# Patient Record
Sex: Female | Born: 1970 | Race: White | Hispanic: No | Marital: Single | Smoking: Never smoker
Health system: Southern US, Community
[De-identification: ages and names within clinical notes are randomized; demographics above are authoritative.]

## PROBLEM LIST (undated history)

## (undated) DIAGNOSIS — K219 Gastro-esophageal reflux disease without esophagitis: Secondary | ICD-10-CM

## (undated) DIAGNOSIS — C539 Malignant neoplasm of cervix uteri, unspecified: Secondary | ICD-10-CM

## (undated) DIAGNOSIS — J302 Other seasonal allergic rhinitis: Secondary | ICD-10-CM

## (undated) DIAGNOSIS — E785 Hyperlipidemia, unspecified: Secondary | ICD-10-CM

## (undated) DIAGNOSIS — I10 Essential (primary) hypertension: Secondary | ICD-10-CM

## (undated) DIAGNOSIS — K279 Peptic ulcer, site unspecified, unspecified as acute or chronic, without hemorrhage or perforation: Secondary | ICD-10-CM

## (undated) DIAGNOSIS — F32A Depression, unspecified: Secondary | ICD-10-CM

## (undated) DIAGNOSIS — F329 Major depressive disorder, single episode, unspecified: Secondary | ICD-10-CM

## (undated) HISTORY — PX: UPPER GASTROINTESTINAL ENDOSCOPY: SHX188

## (undated) HISTORY — DX: Depression, unspecified: F32.A

## (undated) HISTORY — PX: CARDIAC CATHETERIZATION: SHX172

## (undated) HISTORY — PX: TOTAL ABDOMINAL HYSTERECTOMY: SHX209

## (undated) HISTORY — DX: Hyperlipidemia, unspecified: E78.5

## (undated) HISTORY — PX: COMBINED HYSTERECTOMY ABDOMINAL W/ MMK / BURCH PROCEDURE: SUR293

## (undated) HISTORY — DX: Major depressive disorder, single episode, unspecified: F32.9

## (undated) HISTORY — DX: Other seasonal allergic rhinitis: J30.2

## (undated) HISTORY — DX: Essential (primary) hypertension: I10

## (undated) HISTORY — DX: Malignant neoplasm of cervix uteri, unspecified: C53.9

## (undated) HISTORY — PX: BREAST BIOPSY: SHX20

## (undated) HISTORY — DX: Gastro-esophageal reflux disease without esophagitis: K21.9

## (undated) HISTORY — DX: Peptic ulcer, site unspecified, unspecified as acute or chronic, without hemorrhage or perforation: K27.9

---

## 1993-10-22 DIAGNOSIS — C539 Malignant neoplasm of cervix uteri, unspecified: Secondary | ICD-10-CM

## 1993-10-22 HISTORY — DX: Malignant neoplasm of cervix uteri, unspecified: C53.9

## 1997-12-15 ENCOUNTER — Ambulatory Visit (HOSPITAL_COMMUNITY): Admission: RE | Admit: 1997-12-15 | Discharge: 1997-12-15 | Payer: Self-pay | Admitting: Family Medicine

## 1999-12-14 ENCOUNTER — Ambulatory Visit (HOSPITAL_COMMUNITY): Admission: RE | Admit: 1999-12-14 | Discharge: 1999-12-14 | Payer: Self-pay | Admitting: Internal Medicine

## 1999-12-14 ENCOUNTER — Encounter: Payer: Self-pay | Admitting: Internal Medicine

## 1999-12-15 ENCOUNTER — Encounter: Admission: RE | Admit: 1999-12-15 | Discharge: 1999-12-15 | Payer: Self-pay | Admitting: Internal Medicine

## 1999-12-15 ENCOUNTER — Encounter: Payer: Self-pay | Admitting: Internal Medicine

## 1999-12-15 ENCOUNTER — Other Ambulatory Visit: Admission: RE | Admit: 1999-12-15 | Discharge: 1999-12-15 | Payer: Self-pay | Admitting: Family Medicine

## 2000-12-12 ENCOUNTER — Other Ambulatory Visit: Admission: RE | Admit: 2000-12-12 | Discharge: 2000-12-12 | Payer: Self-pay | Admitting: Obstetrics and Gynecology

## 2000-12-13 ENCOUNTER — Ambulatory Visit (HOSPITAL_COMMUNITY): Admission: RE | Admit: 2000-12-13 | Discharge: 2000-12-13 | Payer: Self-pay | Admitting: Internal Medicine

## 2000-12-13 ENCOUNTER — Encounter (INDEPENDENT_AMBULATORY_CARE_PROVIDER_SITE_OTHER): Payer: Self-pay | Admitting: Specialist

## 2001-08-21 ENCOUNTER — Ambulatory Visit (HOSPITAL_COMMUNITY): Admission: RE | Admit: 2001-08-21 | Discharge: 2001-08-21 | Payer: Self-pay | Admitting: Obstetrics and Gynecology

## 2001-08-21 HISTORY — PX: TUBAL LIGATION: SHX77

## 2002-01-16 ENCOUNTER — Other Ambulatory Visit: Admission: RE | Admit: 2002-01-16 | Discharge: 2002-01-16 | Payer: Self-pay | Admitting: Obstetrics and Gynecology

## 2002-04-22 ENCOUNTER — Encounter: Payer: Self-pay | Admitting: Emergency Medicine

## 2002-04-22 ENCOUNTER — Emergency Department (HOSPITAL_COMMUNITY): Admission: EM | Admit: 2002-04-22 | Discharge: 2002-04-22 | Payer: Self-pay | Admitting: Emergency Medicine

## 2003-06-03 ENCOUNTER — Ambulatory Visit (HOSPITAL_COMMUNITY): Admission: RE | Admit: 2003-06-03 | Discharge: 2003-06-03 | Payer: Self-pay | Admitting: Cardiology

## 2003-06-03 ENCOUNTER — Encounter: Payer: Self-pay | Admitting: Cardiology

## 2003-06-08 ENCOUNTER — Other Ambulatory Visit: Admission: RE | Admit: 2003-06-08 | Discharge: 2003-06-08 | Payer: Self-pay | Admitting: Obstetrics and Gynecology

## 2004-01-12 ENCOUNTER — Ambulatory Visit (HOSPITAL_COMMUNITY): Admission: RE | Admit: 2004-01-12 | Discharge: 2004-01-12 | Payer: Self-pay | Admitting: Internal Medicine

## 2004-01-20 ENCOUNTER — Ambulatory Visit (HOSPITAL_COMMUNITY): Admission: RE | Admit: 2004-01-20 | Discharge: 2004-01-20 | Payer: Self-pay | Admitting: Internal Medicine

## 2004-01-26 ENCOUNTER — Observation Stay (HOSPITAL_COMMUNITY): Admission: RE | Admit: 2004-01-26 | Discharge: 2004-01-27 | Payer: Self-pay | Admitting: Surgery

## 2004-01-26 ENCOUNTER — Encounter (INDEPENDENT_AMBULATORY_CARE_PROVIDER_SITE_OTHER): Payer: Self-pay | Admitting: Specialist

## 2004-01-26 HISTORY — PX: LAPAROSCOPIC CHOLECYSTECTOMY: SUR755

## 2004-06-09 ENCOUNTER — Other Ambulatory Visit: Admission: RE | Admit: 2004-06-09 | Discharge: 2004-06-09 | Payer: Self-pay | Admitting: Obstetrics and Gynecology

## 2004-12-01 ENCOUNTER — Ambulatory Visit (HOSPITAL_COMMUNITY): Admission: RE | Admit: 2004-12-01 | Discharge: 2004-12-01 | Payer: Self-pay | Admitting: Obstetrics and Gynecology

## 2004-12-01 ENCOUNTER — Encounter (INDEPENDENT_AMBULATORY_CARE_PROVIDER_SITE_OTHER): Payer: Self-pay | Admitting: *Deleted

## 2004-12-01 HISTORY — PX: ENDOMETRIAL ABLATION: SHX621

## 2005-06-14 ENCOUNTER — Other Ambulatory Visit: Admission: RE | Admit: 2005-06-14 | Discharge: 2005-06-14 | Payer: Self-pay | Admitting: Obstetrics and Gynecology

## 2005-07-16 ENCOUNTER — Ambulatory Visit: Payer: Self-pay | Admitting: Internal Medicine

## 2005-07-16 ENCOUNTER — Observation Stay (HOSPITAL_COMMUNITY): Admission: AD | Admit: 2005-07-16 | Discharge: 2005-07-18 | Payer: Self-pay | Admitting: Internal Medicine

## 2006-05-16 ENCOUNTER — Ambulatory Visit (HOSPITAL_COMMUNITY): Admission: RE | Admit: 2006-05-16 | Discharge: 2006-05-16 | Payer: Self-pay | Admitting: *Deleted

## 2006-10-17 ENCOUNTER — Inpatient Hospital Stay (HOSPITAL_COMMUNITY): Admission: RE | Admit: 2006-10-17 | Discharge: 2006-10-20 | Payer: Self-pay | Admitting: Obstetrics and Gynecology

## 2006-10-17 ENCOUNTER — Encounter (INDEPENDENT_AMBULATORY_CARE_PROVIDER_SITE_OTHER): Payer: Self-pay | Admitting: Specialist

## 2008-02-09 ENCOUNTER — Inpatient Hospital Stay (HOSPITAL_BASED_OUTPATIENT_CLINIC_OR_DEPARTMENT_OTHER): Admission: RE | Admit: 2008-02-09 | Discharge: 2008-02-09 | Payer: Self-pay | Admitting: Cardiovascular Disease

## 2008-02-09 ENCOUNTER — Ambulatory Visit: Payer: Self-pay | Admitting: Cardiovascular Disease

## 2010-01-15 ENCOUNTER — Emergency Department (HOSPITAL_COMMUNITY): Admission: EM | Admit: 2010-01-15 | Discharge: 2010-01-15 | Payer: Self-pay | Admitting: Family Medicine

## 2011-01-15 LAB — POCT RAPID STREP A (OFFICE): Streptococcus, Group A Screen (Direct): NEGATIVE

## 2011-03-06 NOTE — Cardiovascular Report (Signed)
[name], [name]              ACCOUNT NO.:  [id number]   MEDICAL RECORD NO.:  [id number]           PATIENT TYPE:   LOCATION:                                 FACILITY:   PHYSICIAN:  Veverly Fells. Excell Seltzer, MD  DATE OF BIRTH:  June 24, 1971   DATE OF PROCEDURE:  DATE OF DISCHARGE:                            CARDIAC CATHETERIZATION   PROCEDURES:  1. Left heart catheterization.  2. Selective coronary angiography.  3. Left ventricular angiography.   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  She was seen by Dr. Gala Romney this morning in the office.  She  has had an exertional component of her symptoms with constant pressure  and it is worse with walking or other activity.  She has a strong family  history of CAD.  She was referred for cardiac catheterization in the  setting of ongoing symptoms.   Risks and indications of the procedure were reviewed.  Informed consent  was obtained.  The right groin was prepped, draped, and anesthetized  with 1% lidocaine using modified Seldinger technique.  A 4-French sheath  was placed in the right femoral artery.  Standard 4-French Judkins  catheters were used for coronary angiography and left ventriculography.  All catheter exchanges were performed over a guidewire.  There were no  immediate complications.   FINDINGS:  Aortic pressure 112/66 with mean of 87 and left ventricular  pressure 116 over 12.   Coronary angiography:  The left mainstem is widely patent.  There are no  irregularities or stenoses.  The vessel bifurcates into the LAD and left  circumflex.   LAD:  The LAD is large-caliber vessel that courses down and reaches the  LV apex.  There is a small first diagonal and a moderate-sized second  diagonal branch.  The LAD and its diagonal branches are free of any  significant disease.   Left circumflex:  The left circumflex is a large  vessel.  It supplies 2  main OM branches.  Both OMs are widely patent with no significant  stenosis.  The AV groove circumflex is also widely patent with no  stenosis.   Right coronary artery:  The RCA is a dominant vessel.  It supplies 2  small PDA branches and a posterolateral branch.  There is a moderate-  sized acute marginal branch.  There is no significant stenosis  throughout the right coronary artery or its branch vessels.   Left ventriculography:  The left ventricular function is normal.  The  LVEF is estimated at 65%.   ASSESSMENT:  1. Normal coronary arteries.  2. Normal left ventricular function.   DISCUSSION:  We will rule out significant noncardiac causes of chest  pain with a D-dimer and liver function tests as well as an amylase and  lipase to make sure that Rosann Auerbach does not have pancreatitis or any liver  abnormalities as a potential etiology to her pain.  Pending those lab,  we will plan on discharge after her post cath observation.  She was  reassured about  her coronary status.      Veverly Fells. Excell Seltzer, MD  Electronically Signed     MDC/MEDQ  D:  02/09/2008  T:  02/10/2008  Job:  [id number]

## 2011-03-09 NOTE — Op Note (Signed)
NAMEKINZE, LABO              ACCOUNT NO.:  [id number]   MEDICAL RECORD NO.:  [id number]          PATIENT TYPE:  AMB   LOCATION:  SDC                           FACILITY:  [facility]   PHYSICIAN:  Miguel Aschoff, M.D.       DATE OF BIRTH:  08/14/1971   DATE OF PROCEDURE:  12/01/2004  DATE OF DISCHARGE:                                 OPERATIVE REPORT   PREOPERATIVE DIAGNOSIS:  Menorrhagia.   POSTOPERATIVE DIAGNOSIS:  Menorrhagia.   PROCEDURES:  Cervical dilatation, hysteroscopy, uterine curettage,  endometrial ablation with ThermaChoice balloon.   SURGEON:  Miguel Aschoff, M.D.   ANESTHESIA:  General.   COMPLICATIONS:  None.   JUSTIFICATION:  The patient is a 40 year old white female with a history of  menorrhagia, who has been on medical therapy using birth control pills to  control her bleeding despite her prior tubal sterilization.  She presents  now to undergo an outpatient procedure in an effort to control her bleeding,  so she stopped taking her oral contraceptives.  She has given her informed  consent for endometrial ablation.  The risks and benefits of the procedure  were discussed with the patient.   PROCEDURE:  The patient was taken to the operating room and placed in the  supine position. General anesthesia was administered without difficulty.  She was then placed in the dorsal lithotomy position and prepped and draped  in the usual sterile fashion.  The bladder was catheterized.  Examination  was carried out, which revealed the uterus to be anterior, normal size and  shape.  No adnexal masses were noted.  The speculum was placed in the  vaginal vault.  Anterior cervical lip was grasped with the tenaculum and  then the uterus was sounded to 8 cm.  The endocervical canal was sounded to  3.5 cm.  Once this was done, the diagnostic hysteroscope was advanced  through the cervix.  No endocervical lesions were noted.  The endometrial  cavity was visualized and no polyps or  submucous myomas were noted.  At this  point the NovaSure endometrial ablation unit was placed in the endometrial  cavity and positioned but did not succeed in passing the cavity test, so  this procedure was abandoned.  In its place a ThermaChoice balloon system  was used, and without difficulty an eight-minute treatment cycle was carried  out at 87 degrees within the confines of the uterine cavity without  difficulty.  Following the eight-minute ThermaChoice treatment cycle, all  instruments were removed, hemostasis was readily achieved.  The patient was  reversed from the anesthetic and taken to the recovery room in stable  condition.  The estimated blood loss was less than 20 mL.   The plan is for the patient to be discharged home.  Medications for home  include doxycycline 100 mg twice a day x3 days, Darvocet-N 100 one every  four hours as needed for pain.  She will be seen back in four weeks for  follow-up examination, and she is to call for any problems such as fever,  pain or heavy bleeding.  AR/MEDQ  D:  12/01/2004  T:  12/01/2004  Job:  [id number]

## 2011-03-09 NOTE — Op Note (Signed)
[name], [name]              ACCOUNT NO.:  [id number]   MEDICAL RECORD NO.:  [id number]          PATIENT TYPE:  INP   LOCATION:  9319                          FACILITY:  [facility]   PHYSICIAN:  Randye Lobo, M.D.   DATE OF BIRTH:  [birth year]   DATE OF PROCEDURE:  10/17/2006  DATE OF DISCHARGE:                               OPERATIVE REPORT   PREOPERATIVE DIAGNOSIS:  Genuine stress incontinence.   POSTOPERATIVE DIAGNOSIS:  Genuine stress incontinence.   PROCEDURE:  Abdominal Burch procedure with cystoscopy.   SURGEON:  Conley Simmonds, MD   ASSISTANT:  Miguel Aschoff, M.D.   URINE OUTPUT:  Quantity sufficient.   COMPLICATIONS:  None.   INDICATIONS FOR PROCEDURE:  The patient is a 40 year old female with a  history of menorrhagia who is cared for by Dr. Duane Lope.  The patient  had previously undergone an endometrial ablation and continued to have  vaginal bleeding problems and Dr. Tenny Craw is planning to proceed with a  hysterectomy with bilateral salpingo-oophorectomy.  The patient has a  history of urinary leakage with stressful maneuvers and wishes to have  surgical treatment at the time of her hysterectomy.  Urodynamic testing  in the office confirms the presence of genuine stress incontinence.  The  patient is noted to have very minimal vaginal and uterine descensus.  The patient will undergo a Monarch sling procedure if the patient is  able to have a successful laparoscopically assisted vaginal hysterectomy  with bilateral salpingo-oophorectomy.  If the patient has conversion to  an abdominal hysterectomy, the plan is to proceed with an abdominal  Burch procedure with cystoscopy.  Risks, benefits, and alternatives have  been discussed with the patient who wishes to proceed.   FINDINGS:  The patient is noted to have very minimal uterine descensus  with exam under anesthesia and a plan is therefore made by Dr. Tenny Craw to  proceed with an abdominal hysterectomy with bilateral  salpingo-  oophorectomy.   At the time of cystoscopy the bladder was visualized throughout 360  degrees including the bladder dome and trigone.  Ther is no evidence of  a foreign bocy in the bladder or urethra.  The ureters were noted to be  patent bilaterally.   SPECIMENS:  None.   PROCEDURE:  The patient was reidentified in the preoperative hold area.  She was brought down to the operating room where general endotracheal  anesthesia was induced and she was placed in the dorsal lithotomy  position.  The patient was sterilely prepped and draped and Dr. Tenny Craw  performed an examination under anesthesia.  He performed a total  abdominal hysterectomy with bilateral salpingo-oophorectomy.  Please  refer to this dictation separately.   After the vaginal cuff was closed and hemostasis was insured, I took  over the procedure for the abdominal Burch procedure and cystoscopy.  The patient had a self-retaining retractor in the abdomen.  A Cherney  modification was performed of the patient's Pfannenstiel incision.  The  rectus muscles were dissected off of their tendinous insertions  inferiorly using monopolar cautery.  This provided good access into the  retropubic space.  The patient had a Foley catheter to drainage.  Dissection of the paravaginal spaces was performed bluntly and the arcus  tendineus fascia pelvis was identified bilaterally.  The Cooper's  ligaments were reidentified as well.   A gloved hand was then placed inside the vagina and the Burch procedure  was performed with a total of four sutures of 0 Ethibond and with a  double-armed needle.  Two sutures were placed to the right of the  urethra and two sutures were placed to the left of the urethra.  The  first suture that was placed was placed at the level of the mid urethra  and lateral to it and the second suture was placed lateral to this at  the level of the urethrovesical junction.  Each suture was brought  through the  endopelvic fascia in a figure-of-eight fashion and then each  arm of the needles were brought up through the Cooper's ligaments on the  ipsilateral sides.  There was some bleeding with placement of the  vaginal sutures on the patient's left-hand side and the Ethibond sutures  were therefore tied at the level of the vagina.  This did provide good  hemostasis.  After each arm of the sutures were brought through the  Cooper's ligaments, the sutures were tied above the Cooper's ligaments  while elevating the vagina from below.  This provided good elevation of  the urethra.   Cystoscopy well was then performed at this time and the findings are as  noted above.  There was no evidence of a foreign body in the bladder or  the urethra.  The cystoscope was then removed and a Foley catheter was  replaced and the bladder was completely drained.   At this point Dr. Tenny Craw performed closure of the abdomen including  closure of the Cherney modification.  Hemostasis was noted to be good at  this time.  Please refer to his portion of this dictation separately.   The patient's Foley catheter was left to gravity drainage.  There were  no complications to the procedure.  All needle, instrument, sponge  counts were correct.      Randye Lobo, M.D.  Electronically Signed     BES/MEDQ  D:  10/17/2006  T:  10/17/2006  Job:  [id number]

## 2011-03-09 NOTE — Discharge Summary (Signed)
[name], [name]              ACCOUNT NO.:  [id number]   MEDICAL RECORD NO.:  [id number]          PATIENT TYPE:  OBV   LOCATION:  5011                         FACILITY:  [facility]   PHYSICIAN:  Rene Paci, M.D. LHCDATE OF BIRTH:  [birth year]   DATE OF ADMISSION:  07/16/2005  DATE OF DISCHARGE:  07/18/2005                                 DISCHARGE SUMMARY   DISCHARGE DIAGNOSIS:  1.  Acute gastroenteritis.  2.  Hypokalemia secondary to vomiting and diarrhea.   HISTORY OF PRESENT ILLNESS:  The patient is a 40 year old white female who  presents as a direct admission with complaints of abdominal pain, nausea and  vomiting, as well as diarrhea, which started approximately 4-5 days prior to  admission.  At the time of admission, the patient reports resolution of  diarrhea but continued nausea and abdominal pain localized at the epigastric  and right upper quadrant region.   PAST MEDICAL HISTORY:  1.  GERD.  2.  Ulcers.  3.  Depression.   PAST SURGICAL HISTORY:  1.  Status post C-section in 1995.  2.  Status post laparoscopic cholecystectomy in April 2005.  3.  Status post tubal ligation.  4.  Status post endometrial ablation.  5.  Status post multiple endoscopies for ulcers.   HOSPITAL COURSE:  The patient was admitted and placed on antiemetics as well  as IV fluids for hydration.  A KUB was performed which was negative for  small bowel obstruction and showed no free air.  On July 17, 2005, the  patient's diet was advanced to clears and today, has been advanced to full,  regular diet the patient is tolerating without any difficulties.  The  patient was noted to have hypokalemia during this admission and this has  been repleted.  At the time of discharge, the patient's potassium is 3.  We  will give an additional dose of K-Dur prior to discharge.   DISCHARGE MEDICATIONS:  1.  Nexium 40 mg p.o. daily.  2.  Prozac 40 mg p.o. daily.   FOLLOW UP:  The patient is to call  Dr. Felicity Coyer or go to the emergency room  should she develop nausea, vomiting, or diarrhea.      Melissa S. Peggyann Juba, NP      Rene Paci, M.D. [facility]  Electronically Signed    MSO/MEDQ  D:  07/18/2005  T:  07/18/2005  Job:  [id number]

## 2011-03-09 NOTE — Discharge Summary (Signed)
[name], [name]              ACCOUNT NO.:  [id number]   MEDICAL RECORD NO.:  [id number]          PATIENT TYPE:  INP   LOCATION:  9319                          [facility]   PHYSICIAN:  Miguel Aschoff, M.D.       DATE OF BIRTH:  Mar 22, 1971   DATE OF ADMISSION:  10/17/2006  DATE OF DISCHARGE:  10/20/2006                               DISCHARGE SUMMARY   ADMISSION DIAGNOSES:  1. Menorrhagia.  2. Urinary stress incontinence.   FINAL DIAGNOSES:  1. Menorrhagia.  2. Urinary stress incontinence.  3. Extensive adenomyosis.   OPERATIONS AND PROCEDURES:  Total abdominal hysterectomy with bilateral  salpingo-oophorectomy.  Burch procedure.   BRIEF HISTORY:  The patient is a 40 year old white female with a history  of menorrhagia that had been previously treated with continuous birth  control pills and in addition, NovaSure endometrial ablation.  However,  because of recurrent bleeding and pelvic pain, the patient now requested  definitive procedure to be carried out and therefore to control her  bleeding.  She also because of family history of ovarian cancer, stated  that she desired removal of her tubes and ovaries at the time of the  surgery, with the understanding that she would need postoperative  hormonal replacement therapy.  Also because of urinary stress  incontinence confirmed by urodynamic studies, the patient was admitted  to also have a Burch procedure performed by Dr. Edward Jolly to control this  problem.   HOSPITAL COURSE:  Preoperative studies were obtained, this revealed an  initial hemoglobin of 12.9, hematocrit 37.7, white count was 6200.  Chemistry profile was within normal limits.   The patient was taken to the operating room on October 17, 2006.  Examination under anesthesia was carried out.  Revealed a very narrow  pubic arch, in addition there was essentially no uterine descensus.  Because of these findings under anesthesia, it was elected to proceed  with  total abdominal hysterectomy, bilateral salpingo-oophorectomy  rather than laparoscopic hysterectomy.  Surgical procedure was carried  out without difficulty, with the total abdominal hysterectomy and  bilateral salpingo-oophorectomy carried out without difficulty.  This  was followed by a Burch procedure.   The patient's postoperative course was essentially uncomplicated.  She  tolerated increasing ambulation and diet well.  Her hemoglobin remained  stable, reached a nadir point of 11.0.  The patient had no voiding  difficulties with no significant residual urine in the bladder.  By the  third postoperative day was able to be discharged home.   MEDICATIONS FOR HOME:  Tylox 1 every 3 hours as needed for pain.   She was instructed no heavy lifting, place nothing in the vagina.  To  call for any problems such as fever, pain or heavy bleeding.   Final pathology report on the hysterectomy and oophorectomy specimen  revealed benign proliferative endometrium, extensive adenomyosis.  There  was no significant pathology noted on the tubes and ovaries.   Plans is for the patient is to return to the office in 4 weeks for  follow up examination.  She is to call for any problems  such as fever,  pain or heavy bleeding.  She was sent home on a regular diet in  satisfactory condition.      Miguel Aschoff, M.D.  Electronically Signed     AR/MEDQ  D:  10/24/2006  T:  10/24/2006  Job:  [id number]

## 2011-03-09 NOTE — Op Note (Signed)
NAME:  [name], [name]                        ACCOUNT NO.:  [id number]   MEDICAL RECORD NO.:  [id number]                   PATIENT TYPE:  OBV   LOCATION:  0342                                 FACILITY:  [facility]   PHYSICIAN:  Thornton Park. Daphine Deutscher, M.D.             DATE OF BIRTH:  Mar 13, 1971   DATE OF PROCEDURE:  01/26/2004  DATE OF DISCHARGE:                                 OPERATIVE REPORT   PREOPERATIVE DIAGNOSES:  Acalculous biliary dyskinesia/chronic  cholecystitis.   POSTOPERATIVE DIAGNOSES:  Chronic cholecystitis.   SURGEON:  Thornton Park. Daphine Deutscher, M.D.   ASSISTANT:  Angelia Mould. Derrell Lolling, M.D.   PROCEDURE:  Laparoscopic cholecystectomy with intraoperative cholangiogram.   DESCRIPTION OF PROCEDURE:  [name] is a 39 year old white female who  lives in [place], [place] but works down here as a PA for [employer] who has had almost four weeks of recurrent right upper quadrant  pain, nausea and vomiting. She has been endoscoped and worked up by Dr.  Juanda Chance and this showed a gallbladder ultrasound which did not show stones.  Informed consent was obtained regarding the possibility that this would not  relieve her abdominal pain but she was wanting to go ahead and get her  gallbladder out as she felt like her symptoms were most consistent with  biliary dyskinesia.   The patient was taken back to room 12 and given general anesthesia. The  abdomen was prepped with Betadine and draped sterilely. I excised her old  infraumbilical curvilinear incision and went through that and then entered  the fascia longitudinally with a stay suture of #0 Vicryl at the superior  part of that longitudinal incision. The Hasson cannula was inserted and  abdomen was insufflated without difficulty. Three trocars were placed in the  upper abdomen.  The gallbladder was grasped, elevated and it is depicted in  the chart but you can see numerous adhesions of the surrounding omentum  stuck up to the  infundibulum in the neck and the cystic duct region. We took  those down and dissected out Calot's triangle and did find a fairly  remarkable inflammatory resistance in that area and we were able to free  this up.  I put a clip upon the gallbladder and incised it and inserted a  catheter and a dynamic cholangiogram which showed good filling of the cystic  duct with common duct filling and intrahepatic filling and prompt flow into  the duodenum and with a small wisp of contrast going up into the pancreatic  duct.  The cystic duct was triple clipped and divided, cystic artery was  double clipped, divided and the gallbladder was removed from the gallbladder  bed with the hook electrocautery without entering it.  Hemostasis was  achieved as we worked our way up the back wall.  No bleeding or bile leaks  were noted during this process and prior to detachment we looked at the  gallbladder  bed and again no bleeding or bile leaks were noted.  It was  detached and brought out through the umbilicus where it was decompressed and  brought out without difficulty.   The umbilical port was repaired laparoscopic visualization with a figure-of-  eight suture of #0 Vicryl. We then went back and looked in the right upper  quadrant, irrigated and aspirated the irrigant.  The abdomen was deflated,  the  trocars were all removed.  The wounds were all closed with 4-0 Vicryl. Prior  to making the incisions, all were injected with 0.5% Marcaine.  Following  closure, Benzoin and Steri-Strips were applied.  The patient was taken to  the recovery room in satisfactory condition.                                               Thornton Park Daphine Deutscher, M.D.    MBM/MEDQ  D:  01/26/2004  T:  01/26/2004  Job:  [id number]   cc:   [name], M.D. [facility]

## 2011-03-09 NOTE — Op Note (Signed)
[name], WELLIVER              ACCOUNT NO.:  [id number]   MEDICAL RECORD NO.:  [id number]          PATIENT TYPE:  INP   LOCATION:  9319                          FACILITY:  [facility]   PHYSICIAN:  Miguel Aschoff, M.D.       DATE OF BIRTH:  Mar 09, 1971   DATE OF PROCEDURE:  10/17/2006  DATE OF DISCHARGE:                               OPERATIVE REPORT   PREOPERATIVE DIAGNOSIS:  Menorrhagia status post endometrial ablation.   POSTOPERATIVE DIAGNOSIS:  Menorrhagia status post endometrial ablation.   OPERATION:  Total abdominal hysterectomy, bilateral salpingo-  oophorectomy by Dr. Edward Jolly during the Burch procedure.   SURGEON:  Miguel Aschoff, M.D.   ASSISTANT:  Robbie Louis, M.D.   ANESTHESIA:  General.   COMPLICATIONS:  None.   JUSTIFICATION:  The patient is a 40 year old white female with a history  of menorrhagia and pelvic pain.  The patient has been treated previously  with laparoscopy.  In addition, the patient has undergone NovaSure  endometrial ablation only to have return of her heavy bleeding.   CONDITION:  The patient has urinary stress incontinence and requested  this be corrected at the time that her surgery is performed to control  the menorrhagia.  She also has requested that her ovaries be removed at  the time of surgery.  She does understand this will result in her  requiring hormone replacement therapy since she is only 40 years old.  With his information, informed consent has been obtained.   PROCEDURE:  The patient was taken to the operating room, placed in the  supine position and general anesthesia was administered without  difficulty.  She was then placed in modified lithotomy position, prepped  and draped in the usual sterile fashion.  Under anesthesia, an  examination was carried out which revealed absolutely no cervical or  uterine descensus and with the cervix being very high in the vaginal  vault, it was felt that laparoscopic approach to the hysterectomy  was  not indicated.  Therefore it was elected to proceed with total abdominal  hysterectomy and bilateral salpingo-oophorectomy.  A Foley catheter was  inserted.  Pfannenstiel incision was made, extended down through the  subcutaneous tissue with bleeding points being clamped and coagulated as  they were encountered.  The fascia was then identified, incised  transversely and then separated from the underlying rectus muscles.  The  rectus muscles were divided in the midline.  The peritoneum was then  found and entered carefully avoiding underlying structures.  Peritoneal  incision was then extended under direct visualization.  At this point a  self-retaining retractor was placed through the wound.  The viscera  packed out of the pelvis.  Inspection revealed the uterus to be  anterior, normal size and shape.  The bladder peritoneum was  unremarkable.  There was some scarring inside the patient's previous  cesarean section.  Ovaries were within normal limits.  The cul-de-sac  was unremarkable.  Intestinal surfaces appeared to be within normal  limits.  At this point the round ligaments were identified, clamped, cut  and suture ligated using suture ligatures of  0 Vicryl.  Then a bladder  flap was created anteriorly.  At this point perforations were made below  the infundibulopelvic ligaments.  These ligaments were clamped with  Heaney clamps, pedicles were cut and suture ligated using suture  ligatures of 0 Vicryl and then free ties of 0 Vicryl.  At this point the  broad ligament was skeletonized.  The uterine vessels were identified,  clamped with curved Heaney clamps.  The pedicles were cut and suture  ligated using suture ligatures of 0 Vicryl.  Then multiple bites were  taken of the paracervical fascia using straight Heaney clamps.  All the  pedicles were cut and suture ligated using suture ligatures of 0 Vicryl.  The cardinal ligaments were then clamped with curved Heaney clamps,   pedicles cut and suture ligated using suture ligatures of 0 Vicryl and  then the uterosacral ligaments were clamped, cut and suture ligated  again with curved Heaney clamps and suture ligatures of 0 Vicryl.  At  this point it was possible to clamp across the reflection of the vagina  onto the cervix and the specimen was excised.  The specimen consisted of  the cervix, uterus, tubes and ovaries.  The angles of the vaginal cuff  were then ligated using figure-of-eight sutures of 0 Vicryl and then the  cuff was closed using interrupted figure-of-eight sutures of 0 Vicryl.  Inspection was made for hemostasis.  Hemostasis was excellent.  At this  point attention was directed to the space of Retzius and Burch procedure  was then carried out by Dr. Edward Jolly.  This is dictated as a separate note.  Following completion of the Burch procedure, the rectus muscles were  reattached to their insertion at the pubis.  The peritoneum was closed  using running interlocking 0 Vicryl suture.  The fascia was then closed  using two sutures of 0 PDS, each starting at the lateral fascial angles  and meeting in the midline.  Subcutaneous tissue was closed using  interrupted 0 chromic and the skin incision was closed using staples.  The estimated blood loss was approximately 150 cc.  The patient  tolerated the procedure well and went to the recovery room in  satisfactory condition.      Miguel Aschoff, M.D.  Electronically Signed     AR/MEDQ  D:  10/17/2006  T:  10/17/2006  Job:  [id number]

## 2011-03-09 NOTE — Op Note (Signed)
[facility]  Patient:    [name], [name] Visit Number: [id number] MRN: [id number]          Service Type: Attending:  Miguel Aschoff, M.D. Dictated by:   Miguel Aschoff, M.D. Proc. Date: 08/21/01                             Operative Report  PREOPERATIVE DIAGNOSES:         1. Desired sterilization.                                 2. Pelvic pain.  POSTOPERATIVE DIAGNOSES:        1. Desired sterilization.                                 2. Pelvic pain.                                 3. Pelvic endometriosis.  OPERATION:                      1. Laparoscopic tubal sterilization using                                    cautery and division.                                 2. Cautery of endometriosis.  SURGEON:                        Miguel Aschoff, M.D.  ANESTHESIA:                     General.  COMPLICATIONS:                  None.  INDICATIONS:                    The patient is a 40 year old white female with a history of persistent pelvic pain.  The patient has requested that a procedure be carried out to reassess the pain.  The patient had previously been laparoscoped and was noted to have pelvic endometriosis.  In addition, she has requested that a sterilization procedure be performed during the laparoscopy. Informed consent has been obtained.  DESCRIPTION OF PROCEDURE:       The patient was taken to the operating room and placed in the supine position.  General anesthesia was administered without difficulty.  She was then placed in the dorsal lithotomy position, prepped and draped in the usual sterile fashion.  The bladder was catheterized.  The specimen was placed in the vaginal vault and Hulka tenaculum placed through the cervix and held.  At this point, attention was directed to the umbilicus where a small infraumbilical incision was made.  A Verres needle was inserted, and then the abdomen was insufflated with three liters of C02.  Following  insufflation, the laparoscope was placed via the laparoscopic trocar without difficulty.  A second suprapubic 5 mm port was then established under direct visualization.  Inspection of the pelvic organs  showed the uterus to be normal size and shape.  The tubes appeared normal along the course.  The ovaries appeared to be normal along their course and the anterior bladder peritoneum was unremarkable.  There was scarring noted in the cul-de-sac along with peritoneal windows which were felt to be consistent with endometriosis.  After inspection was carried out, the bipolar cautery forceps were introduced and the tubes were grasped in their midportion, cauterized for 3 cm.  After the cauterization was completed, the tubes were divided in this area with good separation and good hemostasis.  Attention was then directed to the implants of endometriosis in the cul-de-sac and these were treated using the bipolar cautery.  The remainder of the patients abdomen was essentially unremarkable.  On completion of the cauterization of the endometriosis and with good hemostasis the CO2 was allowed to escape. All instruments were removed, and small incisions were then closed using subcuticular 4-0 Vicryl.  The estimated blood loss was less than 10 cc.  The patient tolerated the procedure well.  DISPOSITION:  Plan is for the patient to be seen back in four weeks for follow-up examination.  She understands that she is to call if there are any problems such as fever, pain or heavy bleeding.  Medications for home included Tylox one every three to four hours as needed for pain and doxycycline 100 mg b.i.d. x 3 days. Dictated by:   Miguel Aschoff, M.D. Attending:  Miguel Aschoff, M.D. DD:  09/07/01 TD:  09/07/01 Job: [id number]

## 2011-03-09 NOTE — Op Note (Signed)
NAME:  [name], [name]              ACCOUNT NO.:  [id number]   MEDICAL RECORD NO.:  [id number]          PATIENT TYPE:  AMB   LOCATION:  SDC                           [facility]ACILITY:  [facility]   PHYSICIAN:  Miguel Aschoff, M.D.       DATE OF BIRTH:  09-24-1971   DATE OF PROCEDURE:  10/17/2006  DATE OF DISCHARGE:                               OPERATIVE REPORT   PREOPERATIVE DIAGNOSIS:  Dictation ended at this point.      Miguel Aschoff, M.D.  Electronically Signed     AR/MEDQ  D:  10/17/2006  T:  10/17/2006  Job:  [id number]

## 2011-03-09 NOTE — Discharge Summary (Signed)
[facility]  Patient:    [name], [name] Visit Number: [id number] MRN: [id number]          Service Type: DSU Location: [facility] Attending Physician:  Miguel Aschoff Dictated by:   Miguel Aschoff, M.D. Admit Date:  08/21/2001 Discharge Date: 08/21/2001                             Discharge Summary  NO DICTATION Dictated by:   Miguel Aschoff, M.D. Attending Physician:  Miguel Aschoff DD:  09/07/01 TD:  09/08/01 Job: [id number]

## 2011-07-17 LAB — BASIC METABOLIC PANEL
BUN: 9
CO2: 30
Calcium: 9.2
Chloride: 101
Creatinine, Ser: 0.71
GFR calc Af Amer: >60
GFR calc non Af Amer: >60
Glucose, Bld: 101 — ABNORMAL HIGH
Potassium: 3.7
Sodium: 139

## 2011-07-17 LAB — HEPATIC FUNCTION PANEL
ALT: 17
AST: 16
Albumin: 3.1 — ABNORMAL LOW
Alkaline Phosphatase: 92
Bilirubin, Direct: <0.1
Total Bilirubin: 0.1 — ABNORMAL LOW
Total Protein: 5.8 — ABNORMAL LOW

## 2011-07-17 LAB — D-DIMER, QUANTITATIVE: D-Dimer, Quant: <0.22

## 2011-07-17 LAB — CBC
MCHC: 34.4
Platelets: 339
RDW: 12.9
WBC: 5.9

## 2011-07-17 LAB — AMYLASE: Amylase: 36

## 2011-07-17 LAB — PROTIME-INR
INR: 1
Prothrombin Time: 12.9

## 2011-07-17 LAB — LIPASE, BLOOD: Lipase: 14

## 2011-09-10 ENCOUNTER — Telehealth (HOSPITAL_COMMUNITY): Payer: Self-pay | Admitting: *Deleted

## 2011-09-10 NOTE — Telephone Encounter (Signed)
[facility], she is unsure what dose and will call me back tomorrow with that

## 2011-09-11 MED ORDER — SIMVASTATIN 20 MG PO TABS: 20.0000 mg | ORAL_TABLET | Freq: Every evening | ORAL | Status: DC

## 2011-09-11 NOTE — Telephone Encounter (Signed)
Pt is on simva 20, rx sent in

## 2011-09-18 ENCOUNTER — Other Ambulatory Visit: Payer: Self-pay | Admitting: Cardiovascular Disease

## 2011-09-25 ENCOUNTER — Ambulatory Visit (INDEPENDENT_AMBULATORY_CARE_PROVIDER_SITE_OTHER): Payer: 59 | Admitting: Sports Medicine

## 2011-09-25 ENCOUNTER — Encounter: Payer: Self-pay | Admitting: Sports Medicine

## 2011-09-25 ENCOUNTER — Ambulatory Visit (HOSPITAL_COMMUNITY)
Admission: RE | Admit: 2011-09-25 | Discharge: 2011-09-25 | Disposition: A | Discharge status: Home / Self Care | Payer: 59 | Source: Ambulatory Visit | Attending: Family Medicine | Admitting: Family Medicine

## 2011-09-25 VITALS — BP 120/70 | Ht 67.0 in | Wt 230.0 lb

## 2011-09-25 DIAGNOSIS — M5412 Radiculopathy, cervical region: Secondary | ICD-10-CM

## 2011-09-25 DIAGNOSIS — M542 Cervicalgia: Secondary | ICD-10-CM | POA: Insufficient documentation

## 2011-09-25 MED ORDER — GABAPENTIN 300 MG PO CAPS: 300.0000 mg | ORAL_CAPSULE | Freq: Three times a day (TID) | ORAL | Status: DC

## 2011-09-25 NOTE — Progress Notes (Signed)
  Subjective:    Patient ID: [name], female    DOB: 1970-11-24, 40 y.o.   MRN: [id number]  HPI 1. Right scapula pain Standing at sink opening mail. Felt tearing feeling at right scapula. No instigation factor. No recent strenuous exercise. No prior injury. No radiation of pain. She has numb hands often. She has a forward flexed neck angle.    Review of Systems No fever, chills, weight loss, rash, arthralgias, myalgias other than above.    Objective:   Physical Exam Neck: forward flexed resting position of head. Pain on movement of head to the left against pressure.  No pain on palpation of spine. Tender point on right scapula. Full ROM of right shoulder. No weakness in right shoulder. Right scapula inwardly rotated. Scapula increased distance from midline compared to left.     Assessment & Plan:

## 2011-09-25 NOTE — Patient Instructions (Addendum)
Neurontin/Gabapentin - to block pinched nerve pain.   One a night for one week. One twice a day for second week. One three times a day for thirds week.  This could take a week or more for the pain to fully go away.  Please obtain an xray of your cervical spine.Cervical Radiculopathy Cervical radiculopathy is a pinched nerve in the neck. When this happens you may have pain or numbness shooting from your neck all the way down into your arm and fingers. There are many causes of this problem. Sometimes this may happen from an injury or simply from muscle tightness in the neck from overuse. It may also happen from arthritis or bone problems. If there is no improvement after treatment, further studies may be done to find the exact cause. DIAGNOSIS   X-rays may be needed if the problems become long standing. Electromyograms may be done. This study is one in which the working of nerves and muscles is studied. HOME CARE INSTRUCTIONS    Applications of ice packs may be helpful. Ice can be used in a plastic bag with a towel around it to prevent frostbite to skin. This may be used every 2 hours for 20 to 30 minutes, or as needed, while awake or as directed by your caregiver.     Sleep at night with a flat pillow.     Only take over-the-counter or prescription medicines for pain, discomfort, or fever as directed by your caregiver.     If physical therapy was prescribed, follow your caregiver's directions. If a cervical collar or cervical traction device was prescribed, use them as directed.  SEEK IMMEDIATE MEDICAL CARE IF:    You have pain not controlled with medications.     You seem to be getting worse rather than better.     You develop weakness in your hand or arm.     You develop new numbness or loss of feeling in your arms or legs.     You develop loss of bowel or bladder control.     You have difficulty with walking or balance, or develop clumsiness in the use of your arms or legs.  MAKE  SURE YOU:    Understand these instructions.     Will watch your condition.     Will get help right away if you are not doing well or get worse.  Document Released: 07/03/2001 Document Revised: 04/23/2011 Document Reviewed: 02/11/2008 [facility] Patient Information 2012 [place], [place].

## 2011-12-07 ENCOUNTER — Encounter: Payer: Self-pay | Admitting: *Deleted

## 2011-12-07 ENCOUNTER — Ambulatory Visit (INDEPENDENT_AMBULATORY_CARE_PROVIDER_SITE_OTHER): Payer: 59 | Admitting: Internal Medicine

## 2011-12-07 ENCOUNTER — Telehealth: Payer: Self-pay | Admitting: Internal Medicine

## 2011-12-07 VITALS — BP 120/80 | HR 92 | Ht 67.0 in | Wt 247.6 lb

## 2011-12-07 DIAGNOSIS — R1013 Epigastric pain: Secondary | ICD-10-CM

## 2011-12-07 DIAGNOSIS — K219 Gastro-esophageal reflux disease without esophagitis: Secondary | ICD-10-CM

## 2011-12-07 MED ORDER — DEXLANSOPRAZOLE 60 MG PO CPDR: 60.0000 mg | DELAYED_RELEASE_CAPSULE | Freq: Two times a day (BID) | ORAL | Status: DC

## 2011-12-07 MED ORDER — SUCRALFATE 1 GM/10ML PO SUSP: 1.0000 g | Freq: Four times a day (QID) | ORAL | Status: DC

## 2011-12-07 NOTE — Progress Notes (Signed)
[name] [birth year] MRN [id number]   History of Present Illness:  This is a 41 year old white female with epigastric pain, hoarseness and burning substernally. It has not been responsive to Nexium 40 mg twice a day. She has been evaluated by Dr [name] in the last week and was told to have LPR. He has added Zantac 150 mg in the middle of the day. Her symptoms so far have not improved. There is a history of a pyloric channel ulcer on an endoscopy in 2002. A repeat endoscopy to evaluate recurrent dyspepsia in 2005 was negative. Her H. pylori testing was negative. She had a negative upper abdominal ultrasound and HIDA scan in 2006 and a laparoscopic cholecystectomy by Dr. Daphine Mitchell with findings of an inflamed gallbladder. She does not drink alcohol and does not smoke. She does not take any NSAID's. She denies excessive stress at work. Her weight has increased for past several years. She has been on Prozac 40 mg once a day.    Past Medical History  Diagnosis Date  . Hypertension   . Peptic ulcer disease   . GERD (gastroesophageal reflux disease)   . Cervical cancer   . Depression    Past Surgical History  Procedure Date  . Tubal ligation   . Total abdominal hysterectomy   . Combined hysterectomy abdominal w/ mmk / burch procedure   . Endometrial ablation   . Cholecystectomy   . Cesarean section   . Cardiac catheterization     reports that she has never smoked. She does not have any smokeless tobacco history on file. She reports that she does not drink alcohol or use illicit drugs. family history includes Breast cancer in her maternal aunt and paternal aunt; Colon cancer in her paternal grandmother; Heart attack in her mother; Heart disease in her father; Lung cancer in her maternal grandfather; and Uterine cancer in her maternal grandmother. Allergies  Allergen Reactions  . Aspirin   . Neurontin (Gabapentin)         Review of Systems:Occasional dysphagia to solids and  liquids. Epigastric pain. Denies change in bowel habits or rectal bleeding  The remainder of the 10 point ROS is negative except as outlined in H&P   Physical Exam: General appearance  Well developed, in no distress. Eyes- non icteric. HEENT nontraumatic, normocephalic.Voice is raspy and worse  Mouth no lesions, tongue papillated, no cheilosis. Neck supple without adenopathy, thyroid not enlarged, no carotid bruits, no JVD. Lungs Clear to auscultation bilaterally. Cor normal S1, normal S2, regular rhythm, no murmur,  quiet precordium. Abdomen: Soft obese abdomen with normoactive bowel sounds. Tender in the subxiphoid area. Liver edge at costal margin.  Rectal:Not done  Extremities no pedal edema. Skin no lesions. Neurological alert and oriented x 3. Psychological normal mood and affect.  Assessment and Plan:  Problem #1 Recurrent substernal burning and epigastric pain previously encountered in 2002 and 2005. She has a history of a pyloric channel ulcer. We will add Carafate slurry for possible bile reflux gastritis. She will stay on full liquids and a bland diet. We will switch her from Nexium to Dexilant 60 mg by mouth twice a day. I have set her up for an upper endoscopy and biopsies for 12/10/2011.   12/07/2011 [name]

## 2011-12-07 NOTE — Patient Instructions (Addendum)
You have been scheduled for an endoscopy. Please follow written instructions given to you at your visit today. We have sent the following medications to your pharmacy for you to pick up at your convenience: Carafate Slurry We have given you samples of Dexilant to take 1 tablet twice daily in place of Nexium until your procedure.

## 2011-12-07 NOTE — Telephone Encounter (Signed)
Patient is being seen today

## 2011-12-10 ENCOUNTER — Encounter (HOSPITAL_COMMUNITY): Payer: Self-pay

## 2011-12-10 ENCOUNTER — Ambulatory Visit (HOSPITAL_COMMUNITY): Admit: 2011-12-10 | Payer: Self-pay | Admitting: Internal Medicine

## 2011-12-10 SURGERY — EGD (ESOPHAGOGASTRODUODENOSCOPY)
Anesthesia: Moderate Sedation

## 2011-12-11 ENCOUNTER — Ambulatory Visit (AMBULATORY_SURGERY_CENTER): Payer: 59 | Admitting: Internal Medicine

## 2011-12-11 ENCOUNTER — Other Ambulatory Visit: Payer: Self-pay | Admitting: Internal Medicine

## 2011-12-11 ENCOUNTER — Encounter: Payer: Self-pay | Admitting: Internal Medicine

## 2011-12-11 VITALS — BP 129/64 | HR 95 | Temp 99.2°F | Resp 26 | Ht 67.0 in | Wt 247.0 lb

## 2011-12-11 DIAGNOSIS — R112 Nausea with vomiting, unspecified: Secondary | ICD-10-CM

## 2011-12-11 DIAGNOSIS — R109 Unspecified abdominal pain: Secondary | ICD-10-CM

## 2011-12-11 DIAGNOSIS — K2970 Gastritis, unspecified, without bleeding: Secondary | ICD-10-CM

## 2011-12-11 DIAGNOSIS — K219 Gastro-esophageal reflux disease without esophagitis: Secondary | ICD-10-CM

## 2011-12-11 DIAGNOSIS — K2990 Gastroduodenitis, unspecified, without bleeding: Secondary | ICD-10-CM

## 2011-12-11 MED ORDER — HYDROCOD POLST-CHLORPHEN POLST 10-8 MG/5ML PO LQCR: 5.0000 mL | Freq: Two times a day (BID) | ORAL | Status: DC | PRN

## 2011-12-11 MED ORDER — LORAZEPAM 0.5 MG PO TABS: 0.5000 mg | ORAL_TABLET | Freq: Three times a day (TID) | ORAL | Status: DC | PRN

## 2011-12-11 MED ORDER — SODIUM CHLORIDE 0.9 % IV SOLN: 500.0000 mL | INTRAVENOUS | Status: DC

## 2011-12-11 NOTE — Op Note (Signed)
[facility] [address] [place], [place]  [address]  ENDOSCOPY PROCEDURE REPORT  PATIENT:  [name], [name]  MR#:  [id number] BIRTHDATE:  1971-05-06, 40 yrs. old  GENDER:  female  ENDOSCOPIST:  Hedwig Morton. Juanda Chance, MD Referred by:  PROCEDURE DATE:  12/11/2011 PROCEDURE:  EGD with biopsy, [id number] ASA CLASS:  Class I INDICATIONS:  nausea and vomiting, abdominal pain epigastric pain, hoarsness and NCCP since Christmas 2012, refractory to PPI and Carafate, s/p remote chole  MEDICATIONS:   MAC sedation, administered by CRNA, propofol (Diprivan) 180 mg TOPICAL ANESTHETIC:  none  DESCRIPTION OF PROCEDURE:   After the risks benefits and alternatives of the procedure were thoroughly explained, informed consent was obtained.  The Pentax EG-[id number] peds [id number] endoscope was introduced through the mouth and advanced to the second portion of the duodenum, without limitations.  The instrument was slowly withdrawn as the mucosa was fully examined.  The upper, middle, and distal third of the esophagus were carefully inspected and no abnormalities were noted. The z-line was well seen at the GEJ. The endoscope was pushed into the fundus which was normal including a retroflexed view. The antrum,gastric body, first and second part of the duodenum were unremarkable. With standard forceps, a biopsy was obtained and sent to pathology. gastric antrum to r/o H.pylori    Retroflexed views revealed no abnormalities.    The scope was then withdrawn from the patient and the procedure completed.  COMPLICATIONS:  None  ENDOSCOPIC IMPRESSION: 1) Normal EGD nothing to account for abd. pain RECOMMENDATIONS: 1) Await biopsy results CT scan of the abdomen and pelvis with oral and IV contrast continue Carafate, Dexilant, Add Ativan.5 mg tid  REPEAT EXAM:  In 0 year(s) for.  ______________________________ Hedwig Morton. Juanda Chance, MD  CC:  n. eSIGNED:   Hedwig Morton. Gorden Stthomas at 12/11/2011 03:34 PM  Salley Hews, [id number]

## 2011-12-11 NOTE — Patient Instructions (Signed)
YOU HAD AN ENDOSCOPIC PROCEDURE TODAY AT THE  ENDOSCOPY CENTER: Refer to the procedure report that was given to you for any specific questions about what was found during the examination.  If the procedure report does not answer your questions, please call your gastroenterologist to clarify.  If you requested that your care partner not be given the details of your procedure findings, then the procedure report has been included in a sealed envelope for you to review at your convenience later.  YOU SHOULD EXPECT: Some feelings of bloating in the abdomen. Passage of more gas than usual.  Walking can help get rid of the air that was put into your GI tract during the procedure and reduce the bloating. If you had a lower endoscopy (such as a colonoscopy or flexible sigmoidoscopy) you may notice spotting of blood in your stool or on the toilet paper. If you underwent a bowel prep for your procedure, then you may not have a normal bowel movement for a few days.  DIET: Your first meal following the procedure should be a light meal and then it is ok to progress to your normal diet.  A half-sandwich or bowl of soup is an example of a good first meal.  Heavy or fried foods are harder to digest and may make you feel nauseous or bloated.  Likewise meals heavy in dairy and vegetables can cause extra gas to form and this can also increase the bloating.  Drink plenty of fluids but you should avoid alcoholic beverages for 24 hours.  ACTIVITY: Your care partner should take you home directly after the procedure.  You should plan to take it easy, moving slowly for the rest of the day.  You can resume normal activity the day after the procedure however you should NOT DRIVE or use heavy machinery for 24 hours (because of the sedation medicines used during the test).    SYMPTOMS TO REPORT IMMEDIATELY: A gastroenterologist can be reached at any hour.  During normal business hours, 8:30 AM to 5:00 PM Monday through Friday,  call [phone].  After hours and on weekends, please call the GI answering service at [phone] who will take a message and have the physician on call contact you.  Following upper endoscopy (EGD)  Vomiting of blood or coffee ground material  New chest pain or pain under the shoulder blades  Painful or persistently difficult swallowing  New shortness of breath  Fever of 100F or higher  Black, tarry-looking stools  FOLLOW UP: If any biopsies were taken you will be contacted by phone or by letter within the next 1-3 weeks.  Call your gastroenterologist if you have not heard about the biopsies in 3 weeks.  Our staff will call the home number listed on your records the next business day following your procedure to check on you and address any questions or concerns that you may have at that time regarding the information given to you following your procedure. This is a courtesy call and so if there is no answer at the home number and we have not heard from you through the emergency physician on call, we will assume that you have returned to your regular daily activities without incident.  SIGNATURES/CONFIDENTIALITY: You and/or your care partner have signed paperwork which will be entered into your electronic medical record.  These signatures attest to the fact that that the information above on your After Visit Summary has been reviewed and is understood.  Full responsibility of   the confidentiality of this discharge information lies with you and/or your care-partner. 

## 2011-12-11 NOTE — Progress Notes (Signed)
Patient did not experience any of the following events: a burn prior to discharge; a fall within the facility; wrong site/side/patient/procedure/implant event; or a hospital transfer or hospital admission upon discharge from the facility. (G8907) Patient did not have preoperative order for IV antibiotic SSI prophylaxis. (G8918)  

## 2011-12-12 ENCOUNTER — Encounter: Payer: Self-pay | Admitting: Internal Medicine

## 2011-12-12 ENCOUNTER — Telehealth: Payer: Self-pay

## 2011-12-12 ENCOUNTER — Telehealth: Payer: Self-pay | Admitting: Internal Medicine

## 2011-12-12 MED ORDER — DEXLANSOPRAZOLE 60 MG PO CPDR: 60.0000 mg | DELAYED_RELEASE_CAPSULE | Freq: Every day | ORAL | Status: DC

## 2011-12-12 NOTE — Telephone Encounter (Signed)
An rx has been sent to patient's pharmacy for Dexilant once daily dosing (Dr Juanda Chance has asked that she decrease to once daily dosing rather than BID dosing). I have left a message to advise patient.     12/12/2011 11:38 AM Phone (Incoming) Mitchell, Alexandra (Self) [phone] (H) needs an rx called in to her pharmacy for dexilant

## 2011-12-12 NOTE — Telephone Encounter (Signed)
Left message

## 2011-12-13 ENCOUNTER — Ambulatory Visit (INDEPENDENT_AMBULATORY_CARE_PROVIDER_SITE_OTHER)
Admission: RE | Admit: 2011-12-13 | Discharge: 2011-12-13 | Disposition: A | Discharge status: Home / Self Care | Payer: 59 | Source: Ambulatory Visit | Attending: Internal Medicine | Admitting: Internal Medicine

## 2011-12-13 DIAGNOSIS — R109 Unspecified abdominal pain: Secondary | ICD-10-CM

## 2011-12-13 DIAGNOSIS — R079 Chest pain, unspecified: Secondary | ICD-10-CM

## 2011-12-13 DIAGNOSIS — R112 Nausea with vomiting, unspecified: Secondary | ICD-10-CM

## 2011-12-13 MED ORDER — IOHEXOL 300 MG/ML  SOLN
100.0000 mL | Freq: Once | INTRAMUSCULAR | Status: AC | PRN
  Administered 2011-12-13: 100 mL via INTRAVENOUS

## 2011-12-14 ENCOUNTER — Other Ambulatory Visit: Payer: Self-pay | Admitting: *Deleted

## 2011-12-14 DIAGNOSIS — R911 Solitary pulmonary nodule: Secondary | ICD-10-CM

## 2011-12-14 DIAGNOSIS — R05 Cough: Secondary | ICD-10-CM

## 2011-12-17 ENCOUNTER — Encounter: Payer: Self-pay | Admitting: Internal Medicine

## 2011-12-27 ENCOUNTER — Institutional Professional Consult (permissible substitution): Payer: 59 | Admitting: Internal Medicine

## 2011-12-27 ENCOUNTER — Encounter: Payer: Self-pay | Admitting: Emergency Medicine

## 2011-12-27 ENCOUNTER — Ambulatory Visit (INDEPENDENT_AMBULATORY_CARE_PROVIDER_SITE_OTHER): Payer: 59 | Admitting: Emergency Medicine

## 2011-12-27 VITALS — BP 118/78 | HR 64 | Temp 97.7°F | Ht 67.0 in | Wt 248.8 lb

## 2011-12-27 DIAGNOSIS — R911 Solitary pulmonary nodule: Secondary | ICD-10-CM | POA: Insufficient documentation

## 2011-12-27 NOTE — Assessment & Plan Note (Signed)
Age, nodule size (4mm) and non-smoking status make this a low risk lesion. Ms Cauthon does have a 2nd degree relative (maternal GM) who had lung CA, but the predictive value for anyone other than a 1st degree relative has not been studied. The probability of malignancy is ~ 0.9% absent the known hx of another remote CA. Her hx of cervical CA in 1995 increases the probability to ~ 3%. This remains low and it would not be unreasonable based on Fleischner Society criteria to stop following this nodule. Because of the hx of cervical CA and the unclear significance of a 2nd degree relative with lung CA I would prefer to follow the lesion to insure no change in size or appearance. We will schedule CT scan with contrast in 6 months. If there is no interval change then we will repeat a scan, either at the 1 year or 2 year mark, to insure stability. If the lesion does not change over 2 years, then I would stop following it. We discussed the pro's/con's of serial scans and decided to proceed. I'll see her in August after the scan is done.

## 2011-12-27 NOTE — Progress Notes (Signed)
Subjective:    Patient ID: CAMREN HENTHORN, female    DOB: [birth year], 41 y.o.   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 Her medical hx is significant for HTN, hyperlipidemia, GERD and cervical CA dx in 1995 treated successfully with cryotherapy. She has been having difficulty for 2+ months with hoarse voice and dryness with cough. The evaluation has included laryngoscopy by ENT suspicious for GERD vs an allergic rxn with an associated L cord palsy. Her GERD has been evaluated by EGD and aggressively treated. Unfortunately the course has been c/b colitis, etiology unclear but presumed infectious (inproved w abx). Her GI sx are now stabilizing, voice is starting to return. Part of the workup included CT scans of the abd/pelvis and chest. The CT scan chest 12/11/11 showed a 4mm RML nodule that appears to be associated with vessels without clear spiculation. No LAD or other suspicious findings are present. She is referred today regarding the pulmonary nodule.    Review of Systems  Constitutional: Negative for fever, chills, diaphoresis, activity change and appetite change.  HENT: Positive for voice change.   Respiratory: Positive for cough and chest tightness. Negative for apnea, choking, shortness of breath, wheezing and stridor.      Past Medical History  Diagnosis Date  . Hypertension   . Peptic ulcer disease   . GERD (gastroesophageal reflux disease)   . Cervical cancer   . Depression   . Hyperlipidemia   . Urinary incontinence      Family History  Problem Relation Age of Onset  . Colon cancer Paternal Grandmother   . Heart attack Mother   . Breast cancer Paternal Aunt     x 3  . Uterine cancer Maternal Grandmother   . Heart disease Father   . Lung cancer Maternal Grandfather   . Breast cancer Maternal Aunt     x 3     History   Social History  . Marital Status: Married    Spouse Name: N/A   Number of Children: N/A  . Years of Education: N/A   Occupational History  . Not on file.   Social History Main Topics  . Smoking status: Never Smoker   . Smokeless tobacco: Never Used  . Alcohol Use: No  . Drug Use: No  . Sexually Active: Not on file   Other Topics Concern  . Not on file   Social History Narrative  . No narrative on file     Allergies  Allergen Reactions  . Aspirin   . Neurontin (Gabapentin)      Outpatient Prescriptions Prior to Visit  Medication Sig Dispense Refill  . dexlansoprazole (DEXILANT) 60 MG capsule Take 1 capsule (60 mg total) by mouth daily.  30 capsule  3  . estrogens, conjugated, (PREMARIN) 0.625 MG tablet Take 0.625 mg by mouth daily.       Marland Kitchen FLUoxetine (PROZAC) 40 MG capsule Take 40 mg by mouth daily.      Marland Kitchen lisinopril-hydrochlorothiazide (PRINZIDE,ZESTORETIC) 20-25 MG per tablet TAKE 1 TABLET BY MOUTH ONCE DAILY  90 tablet  PRN  . simvastatin (ZOCOR) 20 MG tablet Take 1 tablet (20 mg total) by mouth every evening.  90 tablet  3  . solifenacin (VESICARE) 5 MG tablet Take 5 mg by mouth daily.      . chlorpheniramine-HYDROcodone (TUSSIONEX PENNKINETIC ER) 10-8 MG/5ML LQCR Take 5 mLs by mouth every 12 (twelve) hours  as needed.  180 mL  0  . LORazepam (ATIVAN) 0.5 MG tablet Take 1 tablet (0.5 mg total) by mouth every 8 (eight) hours as needed for anxiety.  90 tablet  1  . ranitidine (ZANTAC) 150 MG tablet Take 150 mg by mouth as needed.      . sucralfate (CARAFATE) 1 GM/10ML suspension Take 10 mLs (1 g total) by mouth 4 (four) times daily.  420 mL  0       Objective:   Physical Exam  Filed Vitals:   12/27/11 1120  BP: 118/78  Pulse: 64  Temp: 97.7 F (36.5 C)   Gen: Pleasant, well-nourished, in no distress,  normal affect  ENT: No lesions,  mouth clear,  oropharynx clear, no postnasal drip  Neck: No JVD  Lungs: No use of accessory muscles, no dullness to percussion, clear without rales or rhonchi  Cardiovascular: RRR, heart  sounds normal, no murmur or gallops, no peripheral edema  Neuro: alert, non focal     Assessment & Plan:  Pulmonary nodule, right Age, nodule size (4mm) and non-smoking status make this a low risk lesion. Ms Blinn does have a 2nd degree relative (maternal GM) who had lung CA, but the predictive value for anyone other than a 1st degree relative has not been studied. The probability of malignancy is ~ 0.9% absent the known hx of another remote CA. Her hx of cervical CA in 1995 increases the probability to ~ 3%. This remains low and it would not be unreasonable based on Fleischner Society criteria to stop following this nodule. Because of the hx of cervical CA and the unclear significance of a 2nd degree relative with lung CA I would prefer to follow the lesion to insure no change in size or appearance. We will schedule CT scan with contrast in 6 months. If there is no interval change then we will repeat a scan, either at the 1 year or 2 year mark, to insure stability. If the lesion does not change over 2 years, then I would stop following it. We discussed the pro's/con's of serial scans and decided to proceed. I'll see her in August after the scan is done.

## 2011-12-27 NOTE — Patient Instructions (Signed)
We will repeat your CT scan of the chest in 05/2012.  Follow with Dr Delton Coombes after the CT to review the results.

## 2011-12-27 NOTE — Progress Notes (Signed)
41 yo woman

## 2012-02-15 ENCOUNTER — Other Ambulatory Visit: Payer: Self-pay | Admitting: Obstetrics and Gynecology

## 2012-02-19 ENCOUNTER — Other Ambulatory Visit: Payer: Self-pay | Admitting: Obstetrics and Gynecology

## 2012-02-19 DIAGNOSIS — R928 Other abnormal and inconclusive findings on diagnostic imaging of breast: Secondary | ICD-10-CM

## 2012-02-21 ENCOUNTER — Ambulatory Visit
Admission: RE | Admit: 2012-02-21 | Discharge: 2012-02-21 | Disposition: A | Discharge status: Home / Self Care | Payer: 59 | Source: Ambulatory Visit | Attending: Obstetrics and Gynecology | Admitting: Obstetrics and Gynecology

## 2012-02-21 ENCOUNTER — Other Ambulatory Visit: Payer: Self-pay | Admitting: Obstetrics and Gynecology

## 2012-02-21 DIAGNOSIS — N6320 Unspecified lump in the left breast, unspecified quadrant: Secondary | ICD-10-CM

## 2012-02-21 DIAGNOSIS — R928 Other abnormal and inconclusive findings on diagnostic imaging of breast: Secondary | ICD-10-CM

## 2012-02-27 ENCOUNTER — Ambulatory Visit
Admission: RE | Admit: 2012-02-27 | Discharge: 2012-02-27 | Disposition: A | Discharge status: Home / Self Care | Payer: 59 | Source: Ambulatory Visit | Attending: Obstetrics and Gynecology | Admitting: Obstetrics and Gynecology

## 2012-02-27 ENCOUNTER — Other Ambulatory Visit: Payer: Self-pay | Admitting: Obstetrics and Gynecology

## 2012-02-27 DIAGNOSIS — N6320 Unspecified lump in the left breast, unspecified quadrant: Secondary | ICD-10-CM

## 2012-04-14 ENCOUNTER — Telehealth (HOSPITAL_COMMUNITY): Payer: Self-pay | Admitting: *Deleted

## 2012-04-14 MED ORDER — LOSARTAN POTASSIUM-HCTZ 100-25 MG PO TABS: 1.0000 | ORAL_TABLET | Freq: Every day | ORAL | Status: DC

## 2012-04-14 NOTE — Telephone Encounter (Signed)
Pt is reporting a cough w/Lisinopril per Dr Gala Romney prescription changed to Hyzaar, pt aware

## 2012-04-29 ENCOUNTER — Other Ambulatory Visit: Payer: Self-pay

## 2012-04-29 DIAGNOSIS — R600 Localized edema: Secondary | ICD-10-CM

## 2012-05-01 ENCOUNTER — Encounter (INDEPENDENT_AMBULATORY_CARE_PROVIDER_SITE_OTHER): Payer: 59

## 2012-05-01 DIAGNOSIS — M7989 Other specified soft tissue disorders: Secondary | ICD-10-CM

## 2012-05-01 DIAGNOSIS — R600 Localized edema: Secondary | ICD-10-CM

## 2012-05-01 DIAGNOSIS — M79609 Pain in unspecified limb: Secondary | ICD-10-CM

## 2012-07-01 ENCOUNTER — Ambulatory Visit (INDEPENDENT_AMBULATORY_CARE_PROVIDER_SITE_OTHER)
Admission: RE | Admit: 2012-07-01 | Discharge: 2012-07-01 | Disposition: A | Discharge status: Home / Self Care | Payer: 59 | Source: Ambulatory Visit | Attending: Emergency Medicine | Admitting: Emergency Medicine

## 2012-07-01 DIAGNOSIS — R911 Solitary pulmonary nodule: Secondary | ICD-10-CM

## 2012-07-01 MED ORDER — IOHEXOL 300 MG/ML  SOLN
80.0000 mL | Freq: Once | INTRAMUSCULAR | Status: AC | PRN
  Administered 2012-07-01: 80 mL via INTRAVENOUS

## 2012-07-04 ENCOUNTER — Ambulatory Visit: Payer: 59 | Admitting: Emergency Medicine

## 2012-09-03 ENCOUNTER — Other Ambulatory Visit (HOSPITAL_COMMUNITY): Payer: Self-pay | Admitting: Internal Medicine

## 2012-12-01 ENCOUNTER — Telehealth (HOSPITAL_COMMUNITY): Payer: Self-pay | Admitting: *Deleted

## 2012-12-01 MED ORDER — MECLIZINE HCL 25 MG PO TABS: 25.0000 mg | ORAL_TABLET | Freq: Two times a day (BID) | ORAL | Status: DC | PRN

## 2012-12-01 NOTE — Telephone Encounter (Signed)
Per Dr Gala Romney pt needs RX for meclizine 25 mg bid, rx sent into pharmacy

## 2012-12-06 ENCOUNTER — Other Ambulatory Visit: Payer: Self-pay

## 2013-04-07 ENCOUNTER — Encounter (HOSPITAL_COMMUNITY): Payer: Self-pay | Admitting: *Deleted

## 2013-04-07 ENCOUNTER — Inpatient Hospital Stay (HOSPITAL_COMMUNITY)
Admission: EM | Admit: 2013-04-07 | Discharge: 2013-04-09 | DRG: 918 | Disposition: A | Discharge status: Other Acute Inpatient Hospital | Payer: 59 | Attending: Internal Medicine | Admitting: Internal Medicine

## 2013-04-07 DIAGNOSIS — Z90710 Acquired absence of both cervix and uterus: Secondary | ICD-10-CM

## 2013-04-07 DIAGNOSIS — E785 Hyperlipidemia, unspecified: Secondary | ICD-10-CM | POA: Diagnosis present

## 2013-04-07 DIAGNOSIS — Z800 Family history of malignant neoplasm of digestive organs: Secondary | ICD-10-CM

## 2013-04-07 DIAGNOSIS — Z8541 Personal history of malignant neoplasm of cervix uteri: Secondary | ICD-10-CM

## 2013-04-07 DIAGNOSIS — Z803 Family history of malignant neoplasm of breast: Secondary | ICD-10-CM

## 2013-04-07 DIAGNOSIS — T43591A Poisoning by other antipsychotics and neuroleptics, accidental (unintentional), initial encounter: Secondary | ICD-10-CM

## 2013-04-07 DIAGNOSIS — F3289 Other specified depressive episodes: Secondary | ICD-10-CM

## 2013-04-07 DIAGNOSIS — F411 Generalized anxiety disorder: Secondary | ICD-10-CM | POA: Diagnosis present

## 2013-04-07 DIAGNOSIS — Y92009 Unspecified place in unspecified non-institutional (private) residence as the place of occurrence of the external cause: Secondary | ICD-10-CM

## 2013-04-07 DIAGNOSIS — Z8249 Family history of ischemic heart disease and other diseases of the circulatory system: Secondary | ICD-10-CM

## 2013-04-07 DIAGNOSIS — K219 Gastro-esophageal reflux disease without esophagitis: Secondary | ICD-10-CM | POA: Diagnosis present

## 2013-04-07 DIAGNOSIS — F332 Major depressive disorder, recurrent severe without psychotic features: Secondary | ICD-10-CM

## 2013-04-07 DIAGNOSIS — F329 Major depressive disorder, single episode, unspecified: Secondary | ICD-10-CM

## 2013-04-07 DIAGNOSIS — T43502A Poisoning by unspecified antipsychotics and neuroleptics, intentional self-harm, initial encounter: Secondary | ICD-10-CM | POA: Diagnosis present

## 2013-04-07 DIAGNOSIS — E876 Hypokalemia: Secondary | ICD-10-CM

## 2013-04-07 DIAGNOSIS — F322 Major depressive disorder, single episode, severe without psychotic features: Secondary | ICD-10-CM | POA: Diagnosis present

## 2013-04-07 DIAGNOSIS — T424X1A Poisoning by benzodiazepines, accidental (unintentional), initial encounter: Secondary | ICD-10-CM

## 2013-04-07 DIAGNOSIS — Z8049 Family history of malignant neoplasm of other genital organs: Secondary | ICD-10-CM

## 2013-04-07 DIAGNOSIS — T424X4A Poisoning by benzodiazepines, undetermined, initial encounter: Principal | ICD-10-CM

## 2013-04-07 DIAGNOSIS — F32A Depression, unspecified: Secondary | ICD-10-CM

## 2013-04-07 DIAGNOSIS — R911 Solitary pulmonary nodule: Secondary | ICD-10-CM

## 2013-04-07 DIAGNOSIS — T1491XA Suicide attempt, initial encounter: Secondary | ICD-10-CM

## 2013-04-07 DIAGNOSIS — I10 Essential (primary) hypertension: Secondary | ICD-10-CM | POA: Diagnosis present

## 2013-04-07 LAB — CBC WITH DIFFERENTIAL/PLATELET
Basophils Absolute: 0.1 10*3/uL (ref 0.0–0.1)
Basophils Relative: 1 % (ref 0–1)
Eosinophils Absolute: 0.1 10*3/uL (ref 0.0–0.7)
Eosinophils Relative: 1 % (ref 0–5)
Lymphocytes Relative: 24 % (ref 12–46)
MCH: 31.8 pg (ref 26.0–34.0)
MCHC: 34.7 g/dL (ref 30.0–36.0)
MCV: 91.5 fL (ref 78.0–100.0)
Monocytes Absolute: 0.4 10*3/uL (ref 0.1–1.0)
Platelets: 308 10*3/uL (ref 150–400)
RDW: 12.9 % (ref 11.5–15.5)
WBC: 6.2 10*3/uL (ref 4.0–10.5)

## 2013-04-07 LAB — RAPID URINE DRUG SCREEN, HOSP PERFORMED
Cocaine: NOT DETECTED
Opiates: NOT DETECTED
Tetrahydrocannabinol: NOT DETECTED

## 2013-04-07 LAB — CBC
MCH: 32 pg (ref 26.0–34.0)
MCHC: 34.7 g/dL (ref 30.0–36.0)
Platelets: 309 10*3/uL (ref 150–400)
RDW: 12.8 % (ref 11.5–15.5)

## 2013-04-07 LAB — URINALYSIS, ROUTINE W REFLEX MICROSCOPIC
Glucose, UA: NEGATIVE mg/dL
Hgb urine dipstick: NEGATIVE
Leukocytes, UA: NEGATIVE
Protein, ur: NEGATIVE mg/dL
Specific Gravity, Urine: 1.007 (ref 1.005–1.030)

## 2013-04-07 LAB — ACETAMINOPHEN LEVEL: Acetaminophen (Tylenol), Serum: <15 ug/mL (ref 10–30)

## 2013-04-07 LAB — COMPREHENSIVE METABOLIC PANEL
ALT: 19 U/L (ref 0–35)
AST: 15 U/L (ref 0–37)
Albumin: 3.7 g/dL (ref 3.5–5.2)
Calcium: 9.4 mg/dL (ref 8.4–10.5)
Creatinine, Ser: 0.82 mg/dL (ref 0.50–1.10)
Sodium: 141 mEq/L (ref 135–145)
Total Protein: 7 g/dL (ref 6.0–8.3)

## 2013-04-07 LAB — CREATININE, SERUM
Creatinine, Ser: 0.81 mg/dL (ref 0.50–1.10)
GFR calc non Af Amer: 88 mL/min — ABNORMAL LOW (ref >90)

## 2013-04-07 LAB — MRSA PCR SCREENING: MRSA by PCR: NEGATIVE

## 2013-04-07 LAB — ETHANOL: Alcohol, Ethyl (B): <11 mg/dL (ref 0–11)

## 2013-04-07 LAB — PREGNANCY, URINE: Preg Test, Ur: NEGATIVE

## 2013-04-07 LAB — PROTIME-INR: Prothrombin Time: 12.4 seconds (ref 11.6–15.2)

## 2013-04-07 LAB — APTT: aPTT: 32 seconds (ref 24–37)

## 2013-04-07 MED ORDER — ONDANSETRON HCL 4 MG/2ML IJ SOLN
4.0000 mg | Freq: Once | INTRAMUSCULAR | Status: AC
  Administered 2013-04-07: 4 mg via INTRAVENOUS

## 2013-04-07 MED ORDER — SODIUM CHLORIDE 0.9 % IV BOLUS (SEPSIS)
1000.0000 mL | Freq: Once | INTRAVENOUS | Status: AC
  Administered 2013-04-07: 1000 mL via INTRAVENOUS

## 2013-04-07 MED ORDER — ACETAMINOPHEN 650 MG RE SUPP: 650.0000 mg | Freq: Four times a day (QID) | RECTAL | Status: DC | PRN

## 2013-04-07 MED ORDER — LOSARTAN POTASSIUM 50 MG PO TABS
100.0000 mg | ORAL_TABLET | Freq: Every day | ORAL | Status: DC
  Administered 2013-04-07 – 2013-04-09 (×3): 100 mg via ORAL
  Filled 2013-04-07 (×3): qty 2

## 2013-04-07 MED ORDER — POTASSIUM CHLORIDE CRYS ER 20 MEQ PO TBCR
40.0000 meq | EXTENDED_RELEASE_TABLET | Freq: Once | ORAL | Status: AC
  Administered 2013-04-07: 40 meq via ORAL
  Filled 2013-04-07: qty 2

## 2013-04-07 MED ORDER — ONDANSETRON HCL 4 MG/2ML IJ SOLN
INTRAMUSCULAR | Status: AC
  Filled 2013-04-07: qty 2

## 2013-04-07 MED ORDER — ACETAMINOPHEN 325 MG PO TABS
650.0000 mg | ORAL_TABLET | Freq: Four times a day (QID) | ORAL | Status: DC | PRN
  Administered 2013-04-07 – 2013-04-09 (×3): 650 mg via ORAL
  Filled 2013-04-07 (×3): qty 2

## 2013-04-07 MED ORDER — SODIUM CHLORIDE 0.9 % IJ SOLN
3.0000 mL | Freq: Two times a day (BID) | INTRAMUSCULAR | Status: DC
  Administered 2013-04-07 – 2013-04-09 (×3): 3 mL via INTRAVENOUS

## 2013-04-07 MED ORDER — SODIUM CHLORIDE 0.9 % IV SOLN
INTRAVENOUS | Status: DC
  Administered 2013-04-07 – 2013-04-08 (×2): via INTRAVENOUS

## 2013-04-07 MED ORDER — ACETAMINOPHEN 325 MG PO TABS: 650.0000 mg | ORAL_TABLET | ORAL | Status: DC | PRN

## 2013-04-07 MED ORDER — BUPROPION HCL ER (XL) 300 MG PO TB24
300.0000 mg | ORAL_TABLET | Freq: Every day | ORAL | Status: DC
  Administered 2013-04-07 – 2013-04-09 (×3): 300 mg via ORAL
  Filled 2013-04-07 (×3): qty 1

## 2013-04-07 MED ORDER — ONDANSETRON HCL 4 MG PO TABS: 4.0000 mg | ORAL_TABLET | Freq: Three times a day (TID) | ORAL | Status: DC | PRN

## 2013-04-07 MED ORDER — ONDANSETRON HCL 4 MG/2ML IJ SOLN
4.0000 mg | Freq: Once | INTRAMUSCULAR | Status: AC
  Administered 2013-04-07: 4 mg via INTRAVENOUS
  Filled 2013-04-07: qty 2

## 2013-04-07 MED ORDER — LOSARTAN POTASSIUM-HCTZ 100-25 MG PO TABS: 1.0000 | ORAL_TABLET | Freq: Every day | ORAL | Status: DC

## 2013-04-07 MED ORDER — ENOXAPARIN SODIUM 40 MG/0.4ML ~~LOC~~ SOLN
40.0000 mg | SUBCUTANEOUS | Status: DC
  Administered 2013-04-07 – 2013-04-08 (×2): 40 mg via SUBCUTANEOUS
  Filled 2013-04-07 (×3): qty 0.4

## 2013-04-07 MED ORDER — HYDROCHLOROTHIAZIDE 25 MG PO TABS
25.0000 mg | ORAL_TABLET | Freq: Every day | ORAL | Status: DC
  Administered 2013-04-07 – 2013-04-09 (×3): 25 mg via ORAL
  Filled 2013-04-07 (×3): qty 1

## 2013-04-07 MED ORDER — DARIFENACIN HYDROBROMIDE ER 7.5 MG PO TB24
7.5000 mg | ORAL_TABLET | Freq: Every day | ORAL | Status: DC
  Administered 2013-04-07 – 2013-04-09 (×3): 7.5 mg via ORAL
  Filled 2013-04-07 (×3): qty 1

## 2013-04-07 MED ORDER — POTASSIUM CHLORIDE 10 MEQ/100ML IV SOLN
10.0000 meq | INTRAVENOUS | Status: DC
  Administered 2013-04-07 (×2): 10 meq via INTRAVENOUS
  Filled 2013-04-07 (×3): qty 100

## 2013-04-07 MED ORDER — ONDANSETRON HCL 4 MG/2ML IJ SOLN
4.0000 mg | Freq: Four times a day (QID) | INTRAMUSCULAR | Status: DC | PRN
  Administered 2013-04-09: 4 mg via INTRAVENOUS
  Filled 2013-04-07: qty 2

## 2013-04-07 MED ORDER — ACTIDOSE WITH SORBITOL 50 GM/240ML PO LIQD
100.0000 g | Freq: Once | ORAL | Status: AC
  Administered 2013-04-07: 100 g via ORAL
  Filled 2013-04-07 (×2): qty 240

## 2013-04-07 MED ORDER — SIMVASTATIN 20 MG PO TABS
20.0000 mg | ORAL_TABLET | Freq: Every day | ORAL | Status: DC
Start: 2013-04-08 — End: 2013-04-09
  Administered 2013-04-08: 20 mg via ORAL
  Filled 2013-04-07 (×2): qty 1

## 2013-04-07 MED ORDER — ONDANSETRON HCL 4 MG PO TABS: 4.0000 mg | ORAL_TABLET | Freq: Four times a day (QID) | ORAL | Status: DC | PRN

## 2013-04-07 MED ORDER — POTASSIUM CHLORIDE 10 MEQ/100ML IV SOLN
10.0000 meq | Freq: Once | INTRAVENOUS | Status: AC
  Administered 2013-04-07: 10 meq via INTRAVENOUS

## 2013-04-07 MED ORDER — PANTOPRAZOLE SODIUM 40 MG PO TBEC
40.0000 mg | DELAYED_RELEASE_TABLET | Freq: Every day | ORAL | Status: DC
  Administered 2013-04-07 – 2013-04-09 (×3): 40 mg via ORAL
  Filled 2013-04-07 (×3): qty 1
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
## 2013-04-07 NOTE — ED Notes (Addendum)
Belongings given to parents.

## 2013-04-07 NOTE — ED Notes (Signed)
Pt drowsy, arouses to stimulation. Sitter at bedside. Continuing to monitor.

## 2013-04-07 NOTE — ED Provider Notes (Signed)
Pt now medically stable.  Pt is stable for ACT evaluation BP 126/101  Pulse 98  Temp(Src) 97.8 F (36.6 C) (Oral)  Resp 15  SpO2 94%  LMP 10/16/2006   Joya Gaskins, MD 04/07/13 [phone]

## 2013-04-07 NOTE — ED Notes (Signed)
Sitter arrived at bedside. Pt finished drinking 2nd cup of charcoal.

## 2013-04-07 NOTE — ED Notes (Signed)
Pt is drinking 2nd cup of charcoal, is drowsy but arousable. Pt speech is slurred. ST on monitor 107.

## 2013-04-07 NOTE — ED Notes (Signed)
This nurse spoke with Arlys John from [employer] regarding patient status.

## 2013-04-07 NOTE — ED Notes (Signed)
Pt family arrived to be with patient.

## 2013-04-07 NOTE — ED Provider Notes (Signed)
Recheck on patient She is still drowsy and will fall asleep during conversation Will admit medically for observation and then will be cleared for [facility]   Alexandra Gaskins, MD 04/07/13 1929

## 2013-04-07 NOTE — ED Notes (Signed)
Pt was on her way to work and states she took unknown amount of Ativan.  Pt states she did take the Ativan to hurt herself and states that she is having some things going on at home.  Pt has some slurry speech.  Pt emotional and crying.  EDP has seen patient and plan to give charcoal.  Pt is drowsy, but arousable.

## 2013-04-07 NOTE — ED Notes (Signed)
AC and CN aware of sitter need

## 2013-04-07 NOTE — ED Notes (Signed)
Pt is more alert and oriented at this moment, sitting up in bed, talking with family members.

## 2013-04-07 NOTE — BH Assessment (Signed)
[facility] Assessment Progress Note      1308:  Attempted to assess pt, but she was asleep and could not participate in assessment.  Will attempt to reassess pt.

## 2013-04-07 NOTE — ED Notes (Signed)
EDP [name] made aware of Potassium of 2.6

## 2013-04-07 NOTE — ED Notes (Signed)
Left voice mail regarding pt's room assignment.

## 2013-04-07 NOTE — ED Provider Notes (Addendum)
History     CSN: [id number]  Arrival date & time 04/07/13  9147   First MD Initiated Contact with Patient 04/07/13 0840      Chief Complaint  Patient presents with  . Medical Clearance  . Drug Overdose    (Consider location/radiation/quality/duration/timing/severity/associated sxs/prior treatment) Patient is a 42 y.o. female presenting with Overdose.  Drug Overdose   Pt reports she was on her way to work just prior to arrival when she took approx 30-40 0.5mg  Ativan tablets as an attempt at self-harm. She denies any other co-ingestions. No EtOH. She has had some life stressors at home. Denies medical complaints on arrival.   Past Medical History  Diagnosis Date  . Hypertension   . Peptic ulcer disease   . GERD (gastroesophageal reflux disease)   . Cervical cancer   . Depression   . Hyperlipidemia   . Urinary incontinence     Past Surgical History  Procedure Laterality Date  . Tubal ligation    . Total abdominal hysterectomy    . Combined hysterectomy abdominal w/ mmk / burch procedure    . Endometrial ablation    . Cholecystectomy    . Cesarean section    . Cardiac catheterization      Family History  Problem Relation Age of Onset  . Colon cancer Paternal Grandmother   . Heart attack Mother   . Breast cancer Paternal Aunt     x 3  . Uterine cancer Maternal Grandmother   . Heart disease Father   . Lung cancer Maternal Grandfather   . Breast cancer Maternal Aunt     x 3    History  Substance Use Topics  . Smoking status: Never Smoker   . Smokeless tobacco: Never Used  . Alcohol Use: No    OB History   Grav Para Term Preterm Abortions TAB SAB Ect Mult Living                  Review of Systems All other systems reviewed and are negative except as noted in HPI.   Allergies  Aspirin; Neurontin; and Ace inhibitors  Home Medications   Current Outpatient Rx  Name  Route  Sig  Dispense  Refill  . EXPIRED: dexlansoprazole (DEXILANT) 60 MG capsule  Oral   Take 1 capsule (60 mg total) by mouth daily.   30 capsule   3   . dicyclomine (BENTYL) 10 MG capsule   Oral   Take 10 mg by mouth 4 (four) times daily -  before meals and at bedtime.         Marland Kitchen estrogens, conjugated, (PREMARIN) 0.625 MG tablet   Oral   Take 0.625 mg by mouth daily.          Marland Kitchen FLUoxetine (PROZAC) 40 MG capsule   Oral   Take 40 mg by mouth daily.         Marland Kitchen losartan-hydrochlorothiazide (HYZAAR) 100-25 MG per tablet   Oral   Take 1 tablet by mouth daily.   30 tablet   12   . meclizine (ANTIVERT) 25 MG tablet   Oral   Take 1 tablet (25 mg total) by mouth 2 (two) times daily as needed.   25 tablet   0   . metroNIDAZOLE (FLAGYL) 250 MG tablet   Oral   Take 250 mg by mouth 3 (three) times daily.         . simvastatin (ZOCOR) 20 MG tablet      TAKE  1 TABLET BY MOUTH EVERY EVENING   90 tablet   3   . solifenacin (VESICARE) 5 MG tablet   Oral   Take 5 mg by mouth daily.           BP 154/110  Pulse 96  Temp(Src) 97.8 F (36.6 C) (Oral)  Resp 20  SpO2 98%  LMP 10/16/2006  Physical Exam  Nursing note and vitals reviewed. Constitutional: She is oriented to person, place, and time. She appears well-developed and well-nourished.  HENT:  Head: Normocephalic and atraumatic.  Eyes: EOM are normal. Pupils are equal, round, and reactive to light.  Neck: Normal range of motion. Neck supple.  Cardiovascular: Normal rate, normal heart sounds and intact distal pulses.   Pulmonary/Chest: Effort normal and breath sounds normal.  Abdominal: Bowel sounds are normal. She exhibits no distension. There is no tenderness.  Musculoskeletal: Normal range of motion. She exhibits no edema and no tenderness.  Neurological: She is alert and oriented to person, place, and time. She has normal strength. No cranial nerve deficit or sensory deficit.  Skin: Skin is warm and dry. No rash noted.  Psychiatric:  Tearful, flat affect    ED Course  Procedures  (including critical care time)  Labs Reviewed  COMPREHENSIVE METABOLIC PANEL - Abnormal; Notable for the following:    Potassium 2.6 (*)    Glucose, Bld 130 (*)    GFR calc non Af Amer 87 (*)    All other components within normal limits  SALICYLATE LEVEL - Abnormal; Notable for the following:    Salicylate Lvl <2.0 (*)    All other components within normal limits  CBC WITH DIFFERENTIAL  LIPASE, BLOOD  URINALYSIS, ROUTINE W REFLEX MICROSCOPIC  PREGNANCY, URINE  PROTIME-INR  APTT  ETHANOL  ACETAMINOPHEN LEVEL  URINE RAPID DRUG SCREEN (HOSP PERFORMED)   No results found.   1. Benzodiazepine overdose, initial encounter   2. Hypokalemia   3. Suicide attempt   4. Depression       MDM  Discussed with poison control who recommends charcoal as patient is still alert and ingestion is recent. With isolated benzo overdose, will plan for close observation and supportive care but do not anticipate hemodynamic compromise or the need for intubation.    Date: 04/07/2013  Rate: 95  Rhythm: normal sinus rhythm  QRS Axis: normal  Intervals: normal  ST/T Wave abnormalities: nonspecific T wave changes  Conduction Disutrbances:none  Narrative Interpretation: artifact  Old EKG Reviewed: none available  10:00 AM Pt able to tolerate charcoal, but becoming more somnolent now as expected. Continue observation, replete K, in and out for urine.   11:32 AM Pt sleeping soundly now, vitals normal.Labs are otherwise normal. Continue observation.   3:36 PM Pt intermittently awake. ACT consulted. Repeat K and APAP level ordered. Awaiting placement. Care signed out to Dr. Bebe Shaggy at the change of shift.   Geral Tuch B. Bernette Mayers, MD 04/07/13 1537

## 2013-04-07 NOTE — ED Notes (Signed)
Vomiting charcoal emesis - unable to focus eyes -- unable to talk more than one or two words at a time, falls back asleep immediately, arouses with verbal and tactile stimuli-- IV patent saline lock in left forearm- friend at bedside

## 2013-04-07 NOTE — ED Notes (Signed)
ACT team at bedside assessing patient.

## 2013-04-07 NOTE — ED Notes (Signed)
Please call Leonette Most and Tedra Senegal (parents) when pt gets a bed assigned.  [phone]

## 2013-04-07 NOTE — H&P (Signed)
[facility] History and Physical  [name]:[id number] DOB: Dec 15, 1970 DOA: 04/07/2013  Referring physician: ER physician. PCP: No primary provider on file.   Chief Complaint:  Benzodiazepine overdose.  HPI: [name] is a 42 y.o. female  with known history of hypertension and hyperlipidemia anxiety had taken at least 40-50 pills of 0.5 mg Ativan intentionally with suicidal ideation. Patient states that she falls feeling depressed and around 8 AM today morning she took those pills. Then she drove her car to work and talked to one of the doctors. And patient was brought to the ER patient was given charcoal and patient was observed. Since patient was still lethargic patient is admitted for further observation. Patient is still depressed. Patient denies any homicidal ideations and has not had any suicidal items before. Patient denies any chest pain shortness of breath nausea vomiting abdominal pain diarrhea palpitations. Denies taking anything else other than Ativan. Patient's Tylenol level and salicylate levels were undetectable.  Review of Systems: As presented in the history of presenting illness, rest negative.  Past Medical History  Diagnosis Date  . Hypertension   . Peptic ulcer disease   . GERD (gastroesophageal reflux disease)   . Cervical cancer   . Depression   . Hyperlipidemia   . Urinary incontinence    Past Surgical History  Procedure Laterality Date  . Tubal ligation    . Total abdominal hysterectomy    . Combined hysterectomy abdominal w/ mmk / burch procedure    . Endometrial ablation    . Cholecystectomy    . Cesarean section    . Cardiac catheterization     Social History:  reports that she has never smoked. She has never used smokeless tobacco. She reports that she does not drink alcohol or use illicit drugs.  home. where does patient live-- can do ADLs. Can patient participate in ADLs?  Allergies  Allergen Reactions  . Aspirin  Shortness Of Breath  . Neurontin (Gabapentin) Other (See Comments)    Loses voice  . Ace Inhibitors Cough    Family History  Problem Relation Age of Onset  . Colon cancer Paternal Grandmother   . Heart attack Mother   . Breast cancer Paternal Aunt     x 3  . Uterine cancer Maternal Grandmother   . Heart disease Father   . Lung cancer Maternal Grandfather   . Breast cancer Maternal Aunt     x 3      Prior to Admission medications   Medication Sig Start Date End Date Taking? Authorizing Provider  acetaminophen (TYLENOL) 500 MG tablet Take 1,000 mg by mouth every 6 (six) hours as needed for pain.   Yes Historical Provider, MD  buPROPion (WELLBUTRIN XL) 300 MG 24 hr tablet Take 300 mg by mouth daily.   Yes Historical Provider, MD  esomeprazole (NEXIUM) 40 MG capsule Take 40 mg by mouth daily before breakfast.   Yes Historical Provider, MD  estrogens, conjugated, (PREMARIN) 0.625 MG tablet Take 0.625 mg by mouth daily.    Yes Historical Provider, MD  losartan-hydrochlorothiazide (HYZAAR) 100-25 MG per tablet Take 1 tablet by mouth daily. 04/14/12 04/14/13 Yes Bevelyn Buckles Bensimhon, MD  simvastatin (ZOCOR) 20 MG tablet TAKE 1 TABLET BY MOUTH EVERY EVENING 09/03/12  Yes Dolores Patty, MD  solifenacin (VESICARE) 5 MG tablet Take 5 mg by mouth daily.   Yes Historical Provider, MD   Physical Exam: Filed Vitals:   04/07/13 1930 04/07/13 1945 04/07/13 2000 04/07/13  2042  BP: 114/66 114/66 111/60 112/71  Pulse: 93 96 91 93  Temp:    98 F (36.7 C)  TempSrc:    Oral  Resp: 17 18 16    Height:    5\' 7"  (1.702 m)  Weight:    112.4 kg (247 lb 12.8 oz)  SpO2: 95% 94% 99% 96%     General:  Well-developed well-nourished.  Eyes:  Anicteric no pallor.  ENT:  No discharge from the ears eyes nose mouth.  Neck:  No mass felt.  Cardiovascular:  S1-S2 heard.  Respiratory:  No rhonchi or crepitations.  Abdomen:  Soft nontender bowel sounds present.  Skin:  No rash.  Musculoskeletal:   No edema.  Psychiatric:  Patient is depressed and still has suicidal ideation.  Neurologic:  Mildly lethargic but easily answers questions. Follows commands. Moves all extremities 5 x 5.  Labs on Admission:  Basic Metabolic Panel:  Recent Labs Lab 04/07/13 0846 04/07/13 1507  NA 141  --   K 2.6* 3.3*  CL 101  --   CO2 27  --   GLUCOSE 130*  --   BUN 9  --   CREATININE 0.82  --   CALCIUM 9.4  --    Liver Function Tests:  Recent Labs Lab 04/07/13 0846  AST 15  ALT 19  ALKPHOS 109  BILITOT 0.3  PROT 7.0  ALBUMIN 3.7    Recent Labs Lab 04/07/13 0846  LIPASE 15   No results found for this basename: AMMONIA,  in the last 168 hours CBC:  Recent Labs Lab 04/07/13 0846  WBC 6.2  NEUTROABS 4.2  HGB 14.2  HCT 40.9  MCV 91.5  PLT 308   Cardiac Enzymes: No results found for this basename: CKTOTAL, CKMB, CKMBINDEX, TROPONINI,  in the last 168 hours  BNP (last 3 results) No results found for this basename: PROBNP,  in the last 8760 hours CBG:  Recent Labs Lab 04/07/13 2004  GLUCAP 105*    Radiological Exams on Admission: No results found.    Assessment/Plan Principal Problem:   Suicide attempt Active Problems:   Benzodiazepine overdose   Hypertension   Hyperlipidemia   Hypokalemia   Depression   1. Drug overdose intentional with suicidal ideation - patient overdosed with benzodiazepine. At this time patient will be closely observed in step down. Patient will be placed on suicide precautions and with sitter. 2. Depression - consult psychiatry in a.m. 3. Hypertension - continue home medications. 4. Hyperlipidemia - continue home medications. 5. Hypokalemia - replace and recheck.     Code Status: Full code.  Family Communication:  none.  Disposition Plan: Admit to inpatient.    Jw Covin N. [facility] Pager [phone].  If 7PM-7AM, please contact night-coverage [email or web address] Password [facility] 04/07/2013, 9:29  PM

## 2013-04-07 NOTE — ED Notes (Signed)
Ordered patient meal tray

## 2013-04-07 NOTE — ED Notes (Signed)
Spoke with family in quiet room- explained visitation policy--and XXX status to family. Explained that only mother was allowed to visit at present, and that husband would not be allowed to visit while in the ED. Security made aware, [employer] made aware.

## 2013-04-08 DIAGNOSIS — T43502A Poisoning by unspecified antipsychotics and neuroleptics, intentional self-harm, initial encounter: Secondary | ICD-10-CM

## 2013-04-08 DIAGNOSIS — F322 Major depressive disorder, single episode, severe without psychotic features: Secondary | ICD-10-CM

## 2013-04-08 LAB — COMPREHENSIVE METABOLIC PANEL
ALT: 15 U/L (ref 0–35)
AST: 13 U/L (ref 0–37)
Alkaline Phosphatase: 94 U/L (ref 39–117)
CO2: 30 mEq/L (ref 19–32)
Calcium: 8.6 mg/dL (ref 8.4–10.5)
GFR calc Af Amer: >90 mL/min (ref >90)
GFR calc non Af Amer: 87 mL/min — ABNORMAL LOW (ref >90)
Glucose, Bld: 88 mg/dL (ref 70–99)
Potassium: 3 mEq/L — ABNORMAL LOW (ref 3.5–5.1)
Sodium: 141 mEq/L (ref 135–145)

## 2013-04-08 LAB — CBC WITH DIFFERENTIAL/PLATELET
Basophils Absolute: 0 10*3/uL (ref 0.0–0.1)
Basophils Relative: 1 % (ref 0–1)
Eosinophils Absolute: 0.1 10*3/uL (ref 0.0–0.7)
HCT: 38.3 % (ref 36.0–46.0)
Hemoglobin: 12.9 g/dL (ref 12.0–15.0)
MCH: 31.3 pg (ref 26.0–34.0)
MCHC: 33.7 g/dL (ref 30.0–36.0)
Monocytes Absolute: 0.4 10*3/uL (ref 0.1–1.0)
Monocytes Relative: 7 % (ref 3–12)
Neutro Abs: 3.7 10*3/uL (ref 1.7–7.7)
Neutrophils Relative %: 55 % (ref 43–77)
RDW: 13 % (ref 11.5–15.5)

## 2013-04-08 MED ORDER — POTASSIUM CHLORIDE CRYS ER 20 MEQ PO TBCR
20.0000 meq | EXTENDED_RELEASE_TABLET | Freq: Every day | ORAL | Status: DC
  Administered 2013-04-09: 20 meq via ORAL
  Filled 2013-04-08 (×2): qty 1

## 2013-04-08 MED ORDER — POTASSIUM CHLORIDE CRYS ER 20 MEQ PO TBCR
40.0000 meq | EXTENDED_RELEASE_TABLET | Freq: Once | ORAL | Status: AC
  Administered 2013-04-08: 40 meq via ORAL
  Filled 2013-04-08: qty 1

## 2013-04-08 MED ORDER — ESTROGENS CONJUGATED 0.625 MG PO TABS
0.6250 mg | ORAL_TABLET | Freq: Every day | ORAL | Status: DC
  Administered 2013-04-08 – 2013-04-09 (×2): 0.625 mg via ORAL
  Filled 2013-04-08 (×2): qty 1

## 2013-04-08 NOTE — Consult Note (Signed)
Reason for Consult: Suicidal attempt and depression Referring Physician: Lonia Blood, MD  [name] is an 42 y.o. female.  HPI: [name] is an 42 y.o. Female Patient was seen and chart reviewed. Patient has been admitted to [facility] step down unit after made a suicide attempt with an overdose on 30-40, 0.5 mg Ativan  yesterdaymorning in an attempt to kill herself.  patient stated that she has a multiple conflicts with her husband andr her work is stressful  She cannot get along with her father and mother in law who stays close to her home. Patient stated that they are negative about everything. 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 Patient has been was at bedside told me that he was contacted by the physician who works with the patient and informed about suicide attempt. Patient has been suffering with the depression and has been DC'd the medication management from the primary care physician from the OB/GYN. Patient reportedly has no previous suicide attempts no previous psychiatric hospitalizations patient has [facility] and psychiatric services at this time. Patient is willing to be admitted to [facility] for crisis stabilization and appropriate medication management. Patient cannot contract for safety at this time.Pt agrees she needs inpatient treatment and is voluntary.   Mental Status Examination: Patient appeared as per his stated age, lying in her bed with a sitter and the patient has been next to her in her room. Patient has decreased psychomotor activity fair eye contact. Patient has depressed and anxious mood and his affect was dysphoric. He has normal rate, rhythm, and no volume of speech. His thought process is linear and goal directed. Patient has suicidal attempt with overdose on benzodiazepines,  denied homicidal ideations, intentions or plans. Patient has no evidence of auditory or visual hallucinations, delusions, and paranoia. Patient has fair to poor insight judgment and impulse control.  Past Medical History  Diagnosis Date  . Hypertension   . Peptic ulcer disease   . GERD (gastroesophageal reflux disease)   . Cervical cancer   . Depression   . Hyperlipidemia   . Urinary incontinence     Past Surgical History  Procedure Laterality Date  . Total abdominal hysterectomy    . Combined hysterectomy abdominal w/ mmk / burch procedure    . Endometrial ablation  12/01/2004    Hattie Perch 03/07/2011 (04/07/2013)  . Cesarean section    . Cardiac catheterization    . Laparoscopic cholecystectomy  01/26/2004    Hattie Perch 03/07/2011 (04/07/2013)  . Tubal ligation  08/21/2001    Hattie Perch 03/07/2011 (04/07/2013)    Family History  Problem Relation Age of Onset  . Colon cancer Paternal Grandmother   . Heart attack Mother   . Breast cancer Paternal Aunt     x 3  . Uterine cancer Maternal Grandmother   . Heart disease Father   . Lung cancer Maternal Grandfather   . Breast cancer Maternal Aunt     x 3    Social History:  reports that she has never smoked. She has never used smokeless tobacco. She reports that she does not drink alcohol or use illicit drugs.  Allergies:  Allergies  Allergen Reactions  . Aspirin Shortness Of Breath  . Neurontin (Gabapentin) Other (See Comments)    Loses voice  . Ace Inhibitors Cough  Medications: I have reviewed the patient's current medications.  Results for orders placed during the hospital encounter of 04/07/13 (from the past 48 hour(s))  CBC WITH DIFFERENTIAL     Status: None   Collection Time    04/07/13  8:46 AM      Result Value Range   WBC 6.2  4.0 - 10.5 K/uL   RBC 4.47  3.87 - 5.11 MIL/uL   Hemoglobin 14.2  12.0 - 15.0 g/dL   HCT 16.1  09.6 - 04.5 %   MCV 91.5  78.0 - 100.0 fL   MCH 31.8  26.0 - 34.0 pg   MCHC 34.7  30.0 - 36.0 g/dL    RDW 40.9  81.1 - 91.4 %   Platelets 308  150 - 400 K/uL   Neutrophils Relative % 67  43 - 77 %   Neutro Abs 4.2  1.7 - 7.7 K/uL   Lymphocytes Relative 24  12 - 46 %   Lymphs Abs 1.5  0.7 - 4.0 K/uL   Monocytes Relative 6  3 - 12 %   Monocytes Absolute 0.4  0.1 - 1.0 K/uL   Eosinophils Relative 1  0 - 5 %   Eosinophils Absolute 0.1  0.0 - 0.7 K/uL   Basophils Relative 1  0 - 1 %   Basophils Absolute 0.1  0.0 - 0.1 K/uL  COMPREHENSIVE METABOLIC PANEL     Status: Abnormal   Collection Time    04/07/13  8:46 AM      Result Value Range   Sodium 141  135 - 145 mEq/L   Potassium 2.6 (*) 3.5 - 5.1 mEq/L   Comment: CRITICAL RESULT CALLED TO, READ BACK BY AND VERIFIED WITH:     M.COMPTON,RN 04/07/13 0957 BY BSLADE   Chloride 101  96 - 112 mEq/L   CO2 27  19 - 32 mEq/L   Glucose, Bld 130 (*) 70 - 99 mg/dL   BUN 9  6 - 23 mg/dL   Creatinine, Ser 7.82  0.50 - 1.10 mg/dL   Calcium 9.4  8.4 - 95.6 mg/dL   Total Protein 7.0  6.0 - 8.3 g/dL   Albumin 3.7  3.5 - 5.2 g/dL   AST 15  0 - 37 U/L   ALT 19  0 - 35 U/L   Alkaline Phosphatase 109  39 - 117 U/L   Total Bilirubin 0.3  0.3 - 1.2 mg/dL   GFR calc non Af Amer 87 (*) >90 mL/min   GFR calc Af Amer >90  >90 mL/min   Comment:            The eGFR has been calculated     using the CKD EPI equation.     This calculation has not been     validated in all clinical     situations.     eGFR's persistently     <90 mL/min signify     possible Chronic Kidney Disease.  LIPASE, BLOOD     Status: None   Collection Time    04/07/13  8:46 AM      Result Value Range   Lipase 15  11 - 59 U/L  PROTIME-INR     Status: None   Collection Time    04/07/13  8:46 AM      Result Value Range   Prothrombin Time 12.4  11.6 - 15.2 seconds   INR 0.93  0.00 - 1.49  APTT     Status: None  Collection Time    04/07/13  8:46 AM      Result Value Range   aPTT 32  24 - 37 seconds  ETHANOL     Status: None   Collection Time    04/07/13  8:46 AM      Result  Value Range   Alcohol, Ethyl (B) <11  0 - 11 mg/dL   Comment:            LOWEST DETECTABLE LIMIT FOR     SERUM ALCOHOL IS 11 mg/dL     FOR MEDICAL PURPOSES ONLY  ACETAMINOPHEN LEVEL     Status: None   Collection Time    04/07/13  8:46 AM      Result Value Range   Acetaminophen (Tylenol), Serum <15.0  10 - 30 ug/mL   Comment:            THERAPEUTIC CONCENTRATIONS VARY     SIGNIFICANTLY. A RANGE OF 10-30     ug/mL MAY BE AN EFFECTIVE     CONCENTRATION FOR MANY PATIENTS.     HOWEVER, SOME ARE BEST TREATED     AT CONCENTRATIONS OUTSIDE THIS     RANGE.     ACETAMINOPHEN CONCENTRATIONS     >150 ug/mL AT 4 HOURS AFTER     INGESTION AND >50 ug/mL AT 12     HOURS AFTER INGESTION ARE     OFTEN ASSOCIATED WITH TOXIC     REACTIONS.  SALICYLATE LEVEL     Status: Abnormal   Collection Time    04/07/13  8:46 AM      Result Value Range   Salicylate Lvl <2.0 (*) 2.8 - 20.0 mg/dL  URINALYSIS, ROUTINE W REFLEX MICROSCOPIC     Status: None   Collection Time    04/07/13 10:20 AM      Result Value Range   Color, Urine YELLOW  YELLOW   APPearance CLEAR  CLEAR   Specific Gravity, Urine 1.007  1.005 - 1.030   pH 7.5  5.0 - 8.0   Glucose, UA NEGATIVE  NEGATIVE mg/dL   Hgb urine dipstick NEGATIVE  NEGATIVE   Bilirubin Urine NEGATIVE  NEGATIVE   Ketones, ur NEGATIVE  NEGATIVE mg/dL   Protein, ur NEGATIVE  NEGATIVE mg/dL   Urobilinogen, UA 0.2  0.0 - 1.0 mg/dL   Nitrite NEGATIVE  NEGATIVE   Leukocytes, UA NEGATIVE  NEGATIVE   Comment: MICROSCOPIC NOT DONE ON URINES WITH NEGATIVE PROTEIN, BLOOD, LEUKOCYTES, NITRITE, OR GLUCOSE <1000 mg/dL.  PREGNANCY, URINE     Status: None   Collection Time    04/07/13 10:20 AM      Result Value Range   Preg Test, Ur NEGATIVE  NEGATIVE   Comment:            THE SENSITIVITY OF THIS     METHODOLOGY IS >20 mIU/mL.  URINE RAPID DRUG SCREEN (HOSP PERFORMED)     Status: None   Collection Time    04/07/13 10:20 AM      Result Value Range   Opiates NONE  DETECTED  NONE DETECTED   Cocaine NONE DETECTED  NONE DETECTED   Benzodiazepines NONE DETECTED  NONE DETECTED   Amphetamines NONE DETECTED  NONE DETECTED   Tetrahydrocannabinol NONE DETECTED  NONE DETECTED   Barbiturates NONE DETECTED  NONE DETECTED   Comment:            DRUG SCREEN FOR MEDICAL PURPOSES     ONLY.  IF CONFIRMATION IS NEEDED  FOR ANY PURPOSE, NOTIFY LAB     WITHIN 5 DAYS.                LOWEST DETECTABLE LIMITS     FOR URINE DRUG SCREEN     Drug Class       Cutoff (ng/mL)     Amphetamine      1000     Barbiturate      200     Benzodiazepine   200     Tricyclics       300     Opiates          300     Cocaine          300     THC              50  POTASSIUM     Status: Abnormal   Collection Time    04/07/13  3:07 PM      Result Value Range   Potassium 3.3 (*) 3.5 - 5.1 mEq/L   Comment: DELTA CHECK NOTED     NO VISIBLE HEMOLYSIS  ACETAMINOPHEN LEVEL     Status: None   Collection Time    04/07/13  3:07 PM      Result Value Range   Acetaminophen (Tylenol), Serum <15.0  10 - 30 ug/mL   Comment:            THERAPEUTIC CONCENTRATIONS VARY     SIGNIFICANTLY. A RANGE OF 10-30     ug/mL MAY BE AN EFFECTIVE     CONCENTRATION FOR MANY PATIENTS.     HOWEVER, SOME ARE BEST TREATED     AT CONCENTRATIONS OUTSIDE THIS     RANGE.     ACETAMINOPHEN CONCENTRATIONS     >150 ug/mL AT 4 HOURS AFTER     INGESTION AND >50 ug/mL AT 12     HOURS AFTER INGESTION ARE     OFTEN ASSOCIATED WITH TOXIC     REACTIONS.  GLUCOSE, CAPILLARY     Status: Abnormal   Collection Time    04/07/13  8:04 PM      Result Value Range   Glucose-Capillary 105 (*) 70 - 99 mg/dL  MRSA PCR SCREENING     Status: None   Collection Time    04/07/13  8:55 PM      Result Value Range   MRSA by PCR NEGATIVE  NEGATIVE   Comment:            The GeneXpert MRSA Assay (FDA     approved for NASAL specimens     only), is one component of a     comprehensive MRSA colonization     surveillance program. It  is not     intended to diagnose MRSA     infection nor to guide or     monitor treatment for     MRSA infections.  CBC     Status: None   Collection Time    04/07/13 10:29 PM      Result Value Range   WBC 8.0  4.0 - 10.5 K/uL   RBC 4.09  3.87 - 5.11 MIL/uL   Hemoglobin 13.1  12.0 - 15.0 g/dL   HCT 16.1  09.6 - 04.5 %   MCV 92.2  78.0 - 100.0 fL   MCH 32.0  26.0 - 34.0 pg   MCHC 34.7  30.0 - 36.0 g/dL   RDW 40.9  81.1 - 91.4 %  Platelets 309  150 - 400 K/uL  CREATININE, SERUM     Status: Abnormal   Collection Time    04/07/13 10:29 PM      Result Value Range   Creatinine, Ser 0.81  0.50 - 1.10 mg/dL   GFR calc non Af Amer 88 (*) >90 mL/min   GFR calc Af Amer >90  >90 mL/min   Comment:            The eGFR has been calculated     using the CKD EPI equation.     This calculation has not been     validated in all clinical     situations.     eGFR's persistently     <90 mL/min signify     possible Chronic Kidney Disease.  COMPREHENSIVE METABOLIC PANEL     Status: Abnormal   Collection Time    04/08/13  5:20 AM      Result Value Range   Sodium 141  135 - 145 mEq/L   Potassium 3.0 (*) 3.5 - 5.1 mEq/L   Chloride 104  96 - 112 mEq/L   CO2 30  19 - 32 mEq/L   Glucose, Bld 88  70 - 99 mg/dL   BUN 6  6 - 23 mg/dL   Creatinine, Ser 1.61  0.50 - 1.10 mg/dL   Calcium 8.6  8.4 - 09.6 mg/dL   Total Protein 6.1  6.0 - 8.3 g/dL   Albumin 3.2 (*) 3.5 - 5.2 g/dL   AST 13  0 - 37 U/L   ALT 15  0 - 35 U/L   Alkaline Phosphatase 94  39 - 117 U/L   Total Bilirubin 0.3  0.3 - 1.2 mg/dL   GFR calc non Af Amer 87 (*) >90 mL/min   GFR calc Af Amer >90  >90 mL/min   Comment:            The eGFR has been calculated     using the CKD EPI equation.     This calculation has not been     validated in all clinical     situations.     eGFR's persistently     <90 mL/min signify     possible Chronic Kidney Disease.  CBC WITH DIFFERENTIAL     Status: None   Collection Time    04/08/13  5:20  AM      Result Value Range   WBC 6.7  4.0 - 10.5 K/uL   RBC 4.12  3.87 - 5.11 MIL/uL   Hemoglobin 12.9  12.0 - 15.0 g/dL   HCT 04.5  40.9 - 81.1 %   MCV 93.0  78.0 - 100.0 fL   MCH 31.3  26.0 - 34.0 pg   MCHC 33.7  30.0 - 36.0 g/dL   RDW 91.4  78.2 - 95.6 %   Platelets 291  150 - 400 K/uL   Neutrophils Relative % 55  43 - 77 %   Neutro Abs 3.7  1.7 - 7.7 K/uL   Lymphocytes Relative 36  12 - 46 %   Lymphs Abs 2.4  0.7 - 4.0 K/uL   Monocytes Relative 7  3 - 12 %   Monocytes Absolute 0.4  0.1 - 1.0 K/uL   Eosinophils Relative 2  0 - 5 %   Eosinophils Absolute 0.1  0.0 - 0.7 K/uL   Basophils Relative 1  0 - 1 %   Basophils Absolute 0.0  0.0 - 0.1 K/uL  No results found.  Positive for depression and Status post suicide attempt with the benzodiazepine overdose. Blood pressure 128/69, pulse 80, temperature 98 F (36.7 C), temperature source Oral, resp. rate 21, height 5\' 7"  (1.702 m), weight 112.4 kg (247 lb 12.8 oz), last menstrual period 10/16/2006, SpO2 96.00%.   Assessment/Plan: Major Depressive disorder, severe without psychosis S/P suicidal attempt   Recommendation:  1. Recommended acute psychiatric hospitalization for crisis stabilization and medication management when medically cleared 2. No medication recommendations at this time  3. Contact psychiatric social service regarding inpatient hospitalization. 4. Appreciate psychiatric consultation and followup as needed   Kirandeep Fariss,JANARDHAHA R. 04/08/2013, 3:58 PM

## 2013-04-08 NOTE — Progress Notes (Signed)
[facility] Progress Note [place] TEAM 1 - Stepdown/ICU TEAM   [name] YNW:[id number] DOB: 1971-05-11 DOA: 04/07/2013 PCP: No primary provider on file.  Brief narrative: 42 y.o. female with known history of hypertension, hyperlipidemia, and  anxiety who had taken at least 40-50 pills of 0.5 mg Ativan intentionally with suicidal ideation. Patient reportedly stated that she was feeling depressed and around 8 AM the morning of her admit she took those pills. She then drove her car to work and talked to one of her coworkers. The patient was brought to the ER and given charcoal and was observed. Patient remained lethargic and therefore was admitted for further observation. Patient denied any homicidal ideations and reported not having had any suicidal ideation before. Patient denied taking anything else other than Ativan. Patient's APAP and salicylate levels were undetectable.   Assessment/Plan:  Benzodiazepine overdose Medically stable at this time - cleared for disposition as per recomendation of Psychiatry  Suicide attempt Psych consult called for ultimate disposition  Hypertension Well controlled at present  Hyperlipidemia Cont med tx  Hypokalemia Persistent - replace  Depression Psych consult called   GERD w/ hx of PUD  Code Status: FULL Family Communication: spoke with pt at bedside after excusing family from room Disposition Plan: medically cleared - awaiting Psych recs  Consultants: Psychiatry (6/18)  Procedures: none  Antibiotics: None  DVT prophylaxis: Lovenox  HPI/Subjective: Patient is resting comfortably in bed.  She denies shortness of breath chest pain sedation/lethargy.  She is tearful and feels that an inpatient psychiatric stay would likely be her best option at present.  Objective: Blood pressure 128/69, pulse 80, temperature 98 F (36.7 C), temperature source Oral, resp. rate 21, height 5\' 7"  (1.702 m), weight 112.4 kg (247 lb  12.8 oz), last menstrual period 10/16/2006, SpO2 96.00%.  Intake/Output Summary (Last 24 hours) at 04/08/13 1419 Last data filed at 04/08/13 0900  Gross per 24 hour  Intake  982.5 ml  Output      0 ml  Net  982.5 ml    Exam: General: No acute respiratory distress Lungs: Clear to auscultation bilaterally without wheezes or crackles Cardiovascular: Regular rate and rhythm without murmur gallop or rub  Abdomen: Nontender, nondistended, soft, bowel sounds positive, no rebound, no ascites, no appreciable mass Extremities: No significant cyanosis, clubbing, or edema bilateral lower extremities  Data Reviewed: Basic Metabolic Panel:  Recent Labs Lab 04/07/13 0846 04/07/13 1507 04/07/13 2229 04/08/13 0520  NA 141  --   --  141  K 2.6* 3.3*  --  3.0*  CL 101  --   --  104  CO2 27  --   --  30  GLUCOSE 130*  --   --  88  BUN 9  --   --  6  CREATININE 0.82  --  0.81 0.82  CALCIUM 9.4  --   --  8.6   Liver Function Tests:  Recent Labs Lab 04/07/13 0846 04/08/13 0520  AST 15 13  ALT 19 15  ALKPHOS 109 94  BILITOT 0.3 0.3  PROT 7.0 6.1  ALBUMIN 3.7 3.2*    Recent Labs Lab 04/07/13 0846  LIPASE 15   CBC:  Recent Labs Lab 04/07/13 0846 04/07/13 2229 04/08/13 0520  WBC 6.2 8.0 6.7  NEUTROABS 4.2  --  3.7  HGB 14.2 13.1 12.9  HCT 40.9 37.7 38.3  MCV 91.5 92.2 93.0  PLT 308 309 291   CBG:  Recent Labs Lab 04/07/13 2004  GLUCAP 105*    Recent Results (from the past 240 hour(s))  MRSA PCR SCREENING     Status: None   Collection Time    04/07/13  8:55 PM      Result Value Range Status   MRSA by PCR NEGATIVE  NEGATIVE Final   Comment:            The GeneXpert MRSA Assay (FDA     approved for NASAL specimens     only), is one component of a     comprehensive MRSA colonization     surveillance program. It is not     intended to diagnose MRSA     infection nor to guide or     monitor treatment for     MRSA infections.     Studies:  Recent x-ray  studies have been reviewed in detail by the Attending Physician  Scheduled Meds:  Scheduled Meds: . buPROPion  300 mg Oral Daily  . darifenacin  7.5 mg Oral Daily  . enoxaparin (LOVENOX) injection  40 mg Subcutaneous Q24H  . losartan  100 mg Oral Daily   And  . hydrochlorothiazide  25 mg Oral Daily  . pantoprazole  40 mg Oral Daily  . simvastatin  20 mg Oral q1800  . sodium chloride  3 mL Intravenous Q12H    Time spent on care of this patient: 25 mins   [facility]  [facility] Office  [phone] Pager - Text Page per Loretha Stapler as per below:  On-Call/Text Page:      [email or web address]      password [email or web address]  If 7PM-7AM, please contact night-coverage [email or web address] Password [email or web address] 04/08/2013, 2:19 PM   LOS: 1 day

## 2013-04-09 ENCOUNTER — Inpatient Hospital Stay (HOSPITAL_COMMUNITY)
Admission: AD | Admit: 2013-04-09 | Discharge: 2013-04-13 | DRG: 885 | Disposition: A | Discharge status: Home / Self Care | Payer: 59 | Source: Intra-hospital | Attending: Psychiatry | Admitting: Psychiatry

## 2013-04-09 ENCOUNTER — Encounter (HOSPITAL_COMMUNITY): Payer: Self-pay | Admitting: *Deleted

## 2013-04-09 DIAGNOSIS — F411 Generalized anxiety disorder: Secondary | ICD-10-CM | POA: Diagnosis present

## 2013-04-09 DIAGNOSIS — I10 Essential (primary) hypertension: Secondary | ICD-10-CM | POA: Diagnosis present

## 2013-04-09 DIAGNOSIS — E785 Hyperlipidemia, unspecified: Secondary | ICD-10-CM | POA: Diagnosis present

## 2013-04-09 DIAGNOSIS — Z79899 Other long term (current) drug therapy: Secondary | ICD-10-CM

## 2013-04-09 DIAGNOSIS — F332 Major depressive disorder, recurrent severe without psychotic features: Secondary | ICD-10-CM

## 2013-04-09 DIAGNOSIS — Z5189 Encounter for other specified aftercare: Secondary | ICD-10-CM

## 2013-04-09 DIAGNOSIS — T1491XA Suicide attempt, initial encounter: Secondary | ICD-10-CM

## 2013-04-09 DIAGNOSIS — K219 Gastro-esophageal reflux disease without esophagitis: Secondary | ICD-10-CM | POA: Diagnosis present

## 2013-04-09 DIAGNOSIS — X838XXA Intentional self-harm by other specified means, initial encounter: Secondary | ICD-10-CM

## 2013-04-09 LAB — BASIC METABOLIC PANEL
BUN: 8 mg/dL (ref 6–23)
CO2: 32 mEq/L (ref 19–32)
Chloride: 104 mEq/L (ref 96–112)
Creatinine, Ser: 0.81 mg/dL (ref 0.50–1.10)
Glucose, Bld: 98 mg/dL (ref 70–99)

## 2013-04-09 MED ORDER — ACETAMINOPHEN 325 MG PO TABS
650.0000 mg | ORAL_TABLET | Freq: Four times a day (QID) | ORAL | Status: DC | PRN
  Administered 2013-04-10: 650 mg via ORAL

## 2013-04-09 MED ORDER — HYDROXYZINE HCL 25 MG PO TABS: 25.0000 mg | ORAL_TABLET | Freq: Four times a day (QID) | ORAL | Status: DC | PRN

## 2013-04-09 MED ORDER — TRAZODONE HCL 50 MG PO TABS: 50.0000 mg | ORAL_TABLET | Freq: Every evening | ORAL | Status: DC | PRN

## 2013-04-09 MED ORDER — HYDROCHLOROTHIAZIDE 25 MG PO TABS
25.0000 mg | ORAL_TABLET | Freq: Every day | ORAL | Status: DC
  Administered 2013-04-10 – 2013-04-13 (×4): 25 mg via ORAL
  Filled 2013-04-09 (×7): qty 1

## 2013-04-09 MED ORDER — MAGNESIUM HYDROXIDE 400 MG/5ML PO SUSP: 30.0000 mL | Freq: Every day | ORAL | Status: DC | PRN

## 2013-04-09 MED ORDER — ESTROGENS CONJUGATED 0.625 MG PO TABS
0.6250 mg | ORAL_TABLET | Freq: Every day | ORAL | Status: DC
  Administered 2013-04-10 – 2013-04-13 (×4): 0.625 mg via ORAL
  Filled 2013-04-09 (×6): qty 1

## 2013-04-09 MED ORDER — PANTOPRAZOLE SODIUM 40 MG PO TBEC
40.0000 mg | DELAYED_RELEASE_TABLET | Freq: Every day | ORAL | Status: DC
  Administered 2013-04-10 – 2013-04-13 (×4): 40 mg via ORAL
  Filled 2013-04-09 (×4): qty 1
  Filled 2013-04-09 (×2): qty 3
  Filled 2013-04-09: qty 1

## 2013-04-09 MED ORDER — SIMVASTATIN 20 MG PO TABS
20.0000 mg | ORAL_TABLET | Freq: Every day | ORAL | Status: DC
  Administered 2013-04-10 – 2013-04-12 (×3): 20 mg via ORAL
  Filled 2013-04-09 (×5): qty 1

## 2013-04-09 MED ORDER — LOSARTAN POTASSIUM 50 MG PO TABS
100.0000 mg | ORAL_TABLET | Freq: Every day | ORAL | Status: DC
  Administered 2013-04-10 – 2013-04-13 (×4): 100 mg via ORAL
  Filled 2013-04-09 (×6): qty 2

## 2013-04-09 MED ORDER — ALUM & MAG HYDROXIDE-SIMETH 200-200-20 MG/5ML PO SUSP: 30.0000 mL | ORAL | Status: DC | PRN

## 2013-04-09 NOTE — Progress Notes (Signed)
Report to [facility], RN, to assume care of patient at transfer to [facility]. Pt discharged in care of security with destination of [facility], Nurse Tech with patient during transfer. Pt taken via wheelchair to security vehicle.

## 2013-04-09 NOTE — Progress Notes (Signed)
Notified Henrietta, Case Manager for unit about order to d/c to [facility], connected to [place], Psych Case Manager and she states she will process transfer.

## 2013-04-09 NOTE — Progress Notes (Signed)
Pt attended karaoke group this evening. Pt did not participate with peers.
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
## 2013-04-09 NOTE — Clinical Social Work Psych Note (Signed)
Psych CSW reviewed chart and discussed pt admission with [facility]).  [facility] states that pt has been accepted; pending a bed.  Fannie Knee in admissions states that there may not be availability today.  Psych CSW gave Fannie Knee [facility] admissions) the 2600 unit phone number in case a bed were to become available this evening the RN would be able to facilitate transfer.  Psych CSW contacted RN to educate on transfer process just in case pt were to d/c after hours.  RN acknowledged understanding and is agreeable to call security for transport in case of bed availability at [facility].  Voluntary Admission and Consent for Treatment form is in pt chart and will need to accompany pt to [facility].   Vickii Penna, LCSWA [phone]  Clinical Social Work

## 2013-04-09 NOTE — Progress Notes (Signed)
Nutrition Brief Note  Malnutrition Screening Tool result is inaccurate. RD reviewed weight hx - pt without significant wt loss. Please consult if nutrition needs are identified.  Jarold Motto MS, RD, LDN Pager: [phone] After-hours pager: [phone]

## 2013-04-09 NOTE — Discharge Summary (Signed)
Physician Discharge Summary  IRMGARD RAMPERSAUD AVW:[id number] DOB: 26-Aug-1971 DOA: 04/07/2013  PCP: No primary provider on file.  Admit date: 04/07/2013 Discharge date: 04/09/2013  Time spent: 45 minutes  Discharge Diagnoses:  Principal Problem:   Suicide attempt Active Problems:   Benzodiazepine overdose   Hypertension   Hyperlipidemia   Hypokalemia   Depression   Discharge Condition: stable  Diet recommendation: heart healthy  Filed Weights   04/07/13 2042 04/08/13 0403 04/08/13 1957  Weight: 112.4 kg (247 lb 12.8 oz) 112.4 kg (247 lb 12.8 oz) 110.4 kg (243 lb 6.2 oz)    History of present illness:  42 y.o. female with known history of hypertension, hyperlipidemia, and anxiety who had taken at least 40-50 pills of 0.5 mg Ativan intentionally with suicidal ideation. Patient reportedly stated that she was feeling depressed and around 8 AM the morning of her admit she took those pills. She then drove her car to work and talked to one of her coworkers. The patient was brought to the ER and given charcoal and was observed. Patient remained lethargic and therefore was admitted for further observation. Patient denied any homicidal ideations and reported not having had any suicidal ideation before. Patient denied taking anything else other than Ativan. Patient's APAP and salicylate levels were undetectable.    Hospital Course:  Benzodiazepine overdose  Medically stable at this time - cleared for transition to behavioral medicine facility  Suicide attempt /Depression  Psych consult recommending inpatient treatment  Hypertension  Well controlled at present   Hyperlipidemia  Cont med tx   Hypokalemia  Replaced again today  GERD w/ hx of PUD  Consultations:  psychiatry  Discharge Exam: Filed Vitals:   04/09/13 0700 04/09/13 0717 04/09/13 0800 04/09/13 0900  BP:  125/63    Pulse:  79    Temp:  98.1 F (36.7 C)    TempSrc:  Oral    Resp: 8 16 17 15   Height:       Weight:      SpO2:  96%      General: AAO x 3, no distress, mood and affect appears to be normal  Cardiovascular: RRR, no murmurs Respiratory: CTA b/l   Discharge Instructions  Discharge Orders   Future Orders Complete By Expires     Diet - low sodium heart healthy  As directed     Increase activity slowly  As directed         Medication List    TAKE these medications       acetaminophen 500 MG tablet  Commonly known as:  TYLENOL  Take 1,000 mg by mouth every 6 (six) hours as needed for pain.     buPROPion 300 MG 24 hr tablet  Commonly known as:  WELLBUTRIN XL  Take 300 mg by mouth daily.     esomeprazole 40 MG capsule  Commonly known as:  NEXIUM  Take 40 mg by mouth daily before breakfast.     estrogens (conjugated) 0.625 MG tablet  Commonly known as:  PREMARIN  Take 0.625 mg by mouth daily.     losartan-hydrochlorothiazide 100-25 MG per tablet  Commonly known as:  HYZAAR  Take 1 tablet by mouth daily.     simvastatin 20 MG tablet  Commonly known as:  ZOCOR  TAKE 1 TABLET BY MOUTH EVERY EVENING     solifenacin 5 MG tablet  Commonly known as:  VESICARE  Take 5 mg by mouth daily.       Allergies  Allergen Reactions  . Aspirin Shortness Of Breath  . Neurontin (Gabapentin) Other (See Comments)    Loses voice  . Ace Inhibitors Cough      The results of significant diagnostics from this hospitalization (including imaging, microbiology, ancillary and laboratory) are listed below for reference.    Significant Diagnostic Studies: No results found.  Microbiology: Recent Results (from the past 240 hour(s))  MRSA PCR SCREENING     Status: None   Collection Time    04/07/13  8:55 PM      Result Value Range Status   MRSA by PCR NEGATIVE  NEGATIVE Final   Comment:            The GeneXpert MRSA Assay (FDA     approved for NASAL specimens     only), is one component of a     comprehensive MRSA colonization     surveillance program. It is not      intended to diagnose MRSA     infection nor to guide or     monitor treatment for     MRSA infections.     Labs: Basic Metabolic Panel:  Recent Labs Lab 04/07/13 0846 04/07/13 1507 04/07/13 2229 04/08/13 0520 04/09/13 0530  NA 141  --   --  141 144  K 2.6* 3.3*  --  3.0* 3.4*  CL 101  --   --  104 104  CO2 27  --   --  30 32  GLUCOSE 130*  --   --  88 98  BUN 9  --   --  6 8  CREATININE 0.82  --  0.81 0.82 0.81  CALCIUM 9.4  --   --  8.6 9.5   Liver Function Tests:  Recent Labs Lab 04/07/13 0846 04/08/13 0520  AST 15 13  ALT 19 15  ALKPHOS 109 94  BILITOT 0.3 0.3  PROT 7.0 6.1  ALBUMIN 3.7 3.2*    Recent Labs Lab 04/07/13 0846  LIPASE 15   No results found for this basename: AMMONIA,  in the last 168 hours CBC:  Recent Labs Lab 04/07/13 0846 04/07/13 2229 04/08/13 0520  WBC 6.2 8.0 6.7  NEUTROABS 4.2  --  3.7  HGB 14.2 13.1 12.9  HCT 40.9 37.7 38.3  MCV 91.5 92.2 93.0  PLT 308 309 291   Cardiac Enzymes: No results found for this basename: CKTOTAL, CKMB, CKMBINDEX, TROPONINI,  in the last 168 hours BNP: BNP (last 3 results) No results found for this basename: PROBNP,  in the last 8760 hours CBG:  Recent Labs Lab 04/07/13 2004  GLUCAP 105*       Signed:  Myleah Cavendish  [facility] 04/09/2013, 10:49 AM

## 2013-04-09 NOTE — Progress Notes (Signed)
IV access removed, no complications noted at site.

## 2013-04-09 NOTE — Tx Team (Signed)
Initial Interdisciplinary Treatment Plan  PATIENT STRENGTHS: (choose at least two) Ability for insight Average or above average intelligence Capable of independent living Supportive family/friends  PATIENT STRESSORS: Financial difficulties Marital or family conflict   PROBLEM LIST: Problem List/Patient Goals Date to be addressed Date deferred Reason deferred Estimated date of resolution  Depression 04/09/13                                                      DISCHARGE CRITERIA:  Ability to meet basic life and health needs Improved stabilization in mood, thinking, and/or behavior Verbal commitment to aftercare and medication compliance  PRELIMINARY DISCHARGE PLAN: Attend aftercare/continuing care group Return to previous living arrangement  PATIENT/FAMIILY INVOLVEMENT: This treatment plan has been presented to and reviewed with the patient, [name], and/or family member, .  The patient and family have been given the opportunity to ask questions and make suggestions.  [name], [place] 04/09/2013, 8:22 PM

## 2013-04-09 NOTE — Progress Notes (Signed)
42 year old female pt admitted on voluntary basis. Pt admitted after overdose on ativan, pt reports that she does not remember where she got the ativan came from. Pt states that she just felt like the world was caving in on her. Pt does report having financial problems and recent problems inside her marriage, pt also reports not sleeping well recently. Pt does endorse depression but denies SI and is able to contract for safety on the unit. Pt stated that she has an OB-GYN that she sees on a regular basis and stated that she receives her prescriptions from him.

## 2013-04-10 DIAGNOSIS — F411 Generalized anxiety disorder: Secondary | ICD-10-CM

## 2013-04-10 DIAGNOSIS — F332 Major depressive disorder, recurrent severe without psychotic features: Principal | ICD-10-CM

## 2013-04-10 MED ORDER — FLUOXETINE HCL 20 MG PO CAPS
20.0000 mg | ORAL_CAPSULE | Freq: Every day | ORAL | Status: DC
  Administered 2013-04-10 – 2013-04-12 (×3): 20 mg via ORAL
  Filled 2013-04-10 (×6): qty 1

## 2013-04-10 MED ORDER — CLONAZEPAM 0.5 MG PO TABS
0.5000 mg | ORAL_TABLET | Freq: Two times a day (BID) | ORAL | Status: DC | PRN
  Administered 2013-04-10: 0.5 mg via ORAL
  Filled 2013-04-10: qty 1

## 2013-04-10 NOTE — BHH Group Notes (Signed)
[facility] LCSW Aftercare Discharge Planning Group Note   04/10/2013 1:12 PM  Participation Quality:  Patient was meeting with MD.   Wynn Banker

## 2013-04-10 NOTE — Clinical Social Work Psych Assess (Signed)
Clinical Social Work Department CLINICAL SOCIAL WORK PSYCHIATRY SERVICE LINE ASSESSMENT 04/10/2013  Patient:  [name]  Account:  [id number]  Admit Date:  04/07/2013  Clinical Social Worker:  Read Drivers  Date/Time:  04/09/2013 02:30 PM Referred by:  Physician  Date referred:  04/09/2013 Reason for Referral  Behavioral Health Issues   Presenting Symptoms/Problems (In the person's/family's own words):   Psych was consulted for SI with OD attempt.   Abuse/Neglect/Trauma History (check all that apply)  Denies history   Abuse/Neglect/Trauma Comments:   none reported or noted in chart   Psychiatric History (check all that apply)  Denies history   Psychiatric medications:  see MAR   Current Mental Health Hospitalizations/Previous Mental Health History:   none reported or noted in chart   Current provider:   none reported or noted in chart   Place and Date:   none reported or noted in chart   Current Medications:   See MAR   Previous Impatient Admission/Date/Reason:   none reported or noted in chart   Emotional Health / Current Symptoms    Suicide/Self Harm  Suicide attempt in past (date/description)  Has a plan for suicide   Suicide attempt in the past:   Pt attempted to commit suicide prior to hospitalization due to Ativan overdose.  Pt states undisclosed amount but thinks it was more than 30 pills   Other harmful behavior:   none reported or noted in chart   Psychotic/Dissociative Symptoms  None reported   Other Psychotic/Dissociative Symptoms:   none reported or noted in the chart    Attention/Behavioral Symptoms  Withdrawn   Other Attention / Behavioral Symptoms:   none reported or noted in chart    Cognitive Impairment  Orientation - Place  Orientation - Self  Orientation - Situation  Orientation - Time  Poor/Impaired Decision-Making   Other Cognitive Impairment:   none reported or noted in chart    Mood and Adjustment   Guarded  DEPRESSION    Stress, Anxiety, Trauma, Any Recent Loss/Stressor  Anxiety  Grief/Loss (recent or history)   Anxiety (frequency):   pt reports having been separated from her husband recently. Pt also reports discord between herself and her parents. Pt reports "life is just closing in on me"   Phobia (specify):   none reported or noted in chart   Compulsive behavior (specify):   none reported or noted in chart   Obsessive behavior (specify):   none reported or noted in chart   Other:   none reported or noted in chart   Substance Abuse/Use  None   SBIRT completed (please refer for detailed history):  N  Self-reported substance use:   none reported or noted in chart  UDS - nothing detected   Urinary Drug Screen Completed:  Y Alcohol level:   BAL<11    Environmental/Housing/Living Arrangement  With Family Member   Who is in the home:   pt currently resides with family   Emergency contact:  [name] - husband who remains supportive   Financial  Stable Income  Private Insurance  Stable Employment   Patient's Strengths and Goals (patient's own words):   Pt is employed.  Pt has medical insurance.  Pt has family who appear supportive and are at bedside.  Pt is compliant with medical advice and agrees to treatment.   Clinical Social Worker's Interpretive Summary:   Psych CSW assessed pt at bedside along with her parents and a Comptroller.  Pt was agreeable  to parents staying in the room while she spoke to me.  Pt presents with depressed mood and tearful affect.  Pt was alert and oriented x4.  Pt denied HI and reports being less suicidal since admission to the hospital.  Pt feels that she needs help sorting through things in her life.  Parents offered support at bedside.    Pt denies SA and ETOH usage.  Pt reports that she cannot remember where she received the pills she OD'd on, but does report that it was not her prescription.  Pt admits to attempting to take her  life.  She reports that she has discord between herself and parents and between herself and her husband, of whom she is currently seperated from.    Pt is agreeable to voluntarily participate in treatment at [facility].  She has been admitted to 505-1 and admitted by Elsie Saas, MD.  Psych CSW facilitate d/c forms and transportation.   Disposition:  Inpatient referral made [facility], [facility], Geri-psych)  Vickii Penna, [place] [phone]  Clinical Social Work
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
## 2013-04-10 NOTE — Progress Notes (Signed)
Date: 04/10/2013  Time: 11:10 AM   Group Topic/Focus: Patients engaged in therapeutic activity where they played coping skills pictionary. Each patient was asked to idenitify one coping skill they currently use and one they have learned from the activity.  Participation Level: Active  Participation Quality: Appropriate  Affect: Appropriate  Cognitive: Appropriate  Insight: Good  Engagement in Group: Engaged  Modes of Intervention: Activity  Additional Comments: Kendall stated that one coping skill she has used in the past is "go for a drive and listen to music". One coping skill she learned from group was "hang out more with friends."   Alyson Reedy  04/10/2013, 11:10 AM

## 2013-04-10 NOTE — Tx Team (Signed)
Interdisciplinary Treatment Plan Update   Date Reviewed:  04/10/2013  Time Reviewed:  9:50 AM  Progress in Treatment:   Attending groups: Yes Participating in groups: Yes Taking medication as prescribed: Yes  Tolerating medication: Yes Family/Significant other contact made: No, but will ask patient for consent for collateral contact Discussing patient identified problems/goals with staff: Yes Medical problems stabilized or resolved: Yes Denies suicidal/homicidal ideation: Yes Patient has not harmed self or others: Yes  For review of initial/current patient goals, please see plan of care.  Estimated Length of Stay:  3-4 days  Reasons for Continued Hospitalization:  Anxiety Depression Medication stabilization Suicidal ideation  New Problems/Goals identified:    Discharge Plan or Barriers:   Home with outpatient follow up  Additional Comments:n/A  Attendees:  Patient:  04/10/2013 9:50 AM   Signature: Mervyn Gay, MD 04/10/2013 9:50 AM  Signature: 04/10/2013 9:50 AM  Signature: Harold Barban, RN 04/10/2013 9:50 AM  Signature:Beverly Terrilee Croak, RN 04/10/2013 9:50 AM  Signature:  Neill Loft RN 04/10/2013 9:50 AM  Signature:  Juline Patch, LCSW 04/10/2013 9:50 AM  Signature:  Reyes Ivan, LCSW 04/10/2013 9:50 AM  Signature:  Sharin Grave Coordinator 04/10/2013 9:50 AM  Signature: Fransisca Kaufmann, [facility] 04/10/2013 9:50 AM  Signature:    Signature:    Signature:      Scribe for Treatment Team:   Juline Patch,  04/10/2013 9:50 AM

## 2013-04-10 NOTE — Progress Notes (Signed)
D) Pt. Affect blunted, but brightens on approach.  Minimally interactive with this RN, due to napping earlier today.  Pt. Reports "sleeping off a HA," which pt. States is" finally gone."  No other c/o offered.   Pt noted enjoying family visit this evening.  Cooperative with medications.  A) Pt. Offered support and staff availability.  Pt. Given medications as per MD order.  Encouraged to attend programming.  R) Pt. Receptive and remains safe on q 15 min. Observations at this time.

## 2013-04-10 NOTE — Progress Notes (Signed)
Adult Psychoeducational Group Note  Date:  04/10/2013 Time:  2:13 PM  Group Topic/Focus:  Early Warning Signs:   The focus of this group is to help patients identify signs or symptoms they exhibit before slipping into an unhealthy state or crisis.  Participation Level:  Minimal  Participation Quality:  Appropriate and Attentive  Affect:  Appropriate  Cognitive:  Appropriate  Insight: Improving  Engagement in Group:  Improving  Modes of Intervention:  Discussion, Education and Support  Additional Comments:  Dalal attended group and has a minimal participation during group. Patient was asked to  share a time in life where relapse occurred and shard the changes that happened during that time of crisis, patient did not share. Patient shared behavior, attitude, feelings, and thought changes during the time of crisis. Identifying the warning signs and learning how to cope and identify triggers during a time of crisis was a way for patient to understand what could be done differently if in time of need of crisis.    Karleen Hampshire Brittini 04/10/2013, 2:13 PM

## 2013-04-10 NOTE — Progress Notes (Signed)
D: Pt. Is quite and teary eyed. R: Writer introduced self to client and and encouraged karaoke. Staff will monitor q47min for safety. Pt. Attended karaoke.

## 2013-04-10 NOTE — [facility] Group Notes (Signed)
[facility] LCSW Group Therapy  Feelings Around Relapse 1:15 -2:30        04/10/2013  1:13 PM   Type of Therapy:  Group Therapy  Participation Level:  Appropriate  Participation Quality:  Appropriate  Affect:  Appropriate, Depressed, Tearful  Cognitive:  Attentive Appropriate  Insight:  Engaged  Engagement in Therapy:  Engaged  Modes of Intervention:  Discussion Exploration Problem-Solving Supportive  Summary of Progress/Problems:  The topic for today was feelings around relapse.  Patient discussed what relapse prevention is to them and identified triggers that are on the patient to relapse.  Patient processed his/her feelings toward relapse and was able to related to peers. Patient identified coping skills that can be used to prevent a relapse.  Patient shared she has to allow her family to help her when needed.   Wynn Banker 04/10/2013 1:13 PM

## 2013-04-10 NOTE — [facility] Counselor (Signed)
Adult Comprehensive Assessment  Patient ID: [name], female   DOB: 24-Feb-1971, 42 y.o.   MRN: [id number]  Information Source: Information source: Patient  Current Stressors:  Educational / Learning stressors: None Employment / Job issues: Employed  Family Relationships: None Surveyor, quantity / Lack of resources (include bankruptcy): None Housing / Lack of housing: None Physical health (include injuries & life threatening diseases): Right vocal cord paralzyed Social relationships: None Substance abuse: None Bereavement / Loss: None  Living/Environment/Situation:  Living Arrangements: Spouse/significant other;Children Living conditions (as described by patient or guardian): Pt reports this is a good environment for the most part.  How long has patient lived in current situation?: 12 years What is atmosphere in current home: Comfortable;Supportive;Loving  Family History:  Marital status: Married Number of Years Married: 12 What types of issues is patient dealing with in the relationship?: Pt recently found out husband has had affairs.  Additional relationship information: N/A Does patient have children?: Yes How many children?: 1 How is patient's relationship with their children?: Pt has a son and has a good relationship with him.    Childhood History:  By whom was/is the patient raised?: Mother/father and step-parent Additional childhood history information: Pt states that she had a great childhood with no issues Description of patient's relationship with caregiver when they were a child: Pt had a good relationship with her parents growing up.  Patient's description of current relationship with people who raised him/her: Pt still has a good relationship with parents today.   Does patient have siblings?: Yes Number of Siblings: 4 Description of patient's current relationship with siblings: Pt has a distant relationship with siblings.   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) How long has patient been employed?: 21 years Patient's job has been impacted by current illness: No What is the longest time patient has a held a job?: 21 years Where was the patient employed at that time?: current job at [employer] Has patient ever been in the [employer]?: No Has patient ever served in Buyer, retail?: No  Financial Resources:   Financial resources: Income from [employer] Insurance insurance Does patient have a representative payee or guardian?: No  Alcohol/Substance Abuse:   What has been your use of drugs/alcohol within the last 12 months?: Pt denies alcohol or drug use If attempted suicide, did drugs/alcohol play a role in this?: No Alcohol/Substance Abuse Treatment Hx: Denies past history If yes, describe treatment: N/A Has alcohol/substance abuse ever caused legal problems?: No  Social Support System:   Patient's Community Support System: Good Describe Community Support System: Church is supportive Type of faith/religion: Christian How does patient's faith help to cope with current illness?: prayer, talk with Education officer, environmental, church attendance  Leisure/Recreation:   Leisure and Hobbies: Therapist, occupational, spending time with family  Strengths/Needs:   What things does the patient do well?: Pt states that she is very good at managing and organizing In what areas does patient struggle / problems for  patient: Depression, avoidant behaviors  Discharge Plan:  Does patient have access to transportation?: Yes Will patient be returning to same living situation after discharge?: Yes Currently receiving community mental health services: No If no, would patient like referral for services when discharged?: Yes (What county?) [facility]) Does patient have financial barriers related to discharge medications?: No  Summary/Recommendations:     Patient is a 42 year old Caucasian Female with a diagnosis of Major Depressive Disorder.  Patient lives in [place] with her family.  Pt states that she became depressed after finding out her husband had 2 affairs.  Patient will benefit from crisis stabilization, medication evaluation, group therapy and psycho education in addition to case management for discharge planning.    Horton, Salome Arnt. 04/10/2013

## 2013-04-10 NOTE — Progress Notes (Signed)
[facility] Group Notes:  (Nursing/MHT/Case Management/Adjunct)  Date:  04/10/2013  Time:  10:38 PM  Type of Therapy:  Group Therapy  Participation Level:  Active  Participation Quality:  Appropriate  Affect:  Appropriate  Cognitive:  Appropriate  Insight:  Appropriate  Engagement in Group:  Engaged  Modes of Intervention:  Socialization and Support  Summary of Progress/Problems: Pt. Stated she would talk with support instead of hold it in to prevent relapse.  Pt. Stated she would also walk as a coping skill.  Sondra Come 04/10/2013, 10:38 PM

## 2013-04-10 NOTE — [facility] Suicide Risk Assessment (Signed)
Suicide Risk Assessment  Admission Assessment     Nursing information obtained from:  Patient Demographic factors:  Caucasian Current Mental Status:  NA Loss Factors:  Financial problems / change in socioeconomic status Historical Factors:  Family history of mental illness or substance abuse Risk Reduction Factors:  Sense of responsibility to family;Employed;Living with another person, especially a relative;Positive social support  CLINICAL FACTORS:   Severe Anxiety and/or Agitation Depression:   Anhedonia Hopelessness Impulsivity Recent sense of peace/wellbeing Severe Unstable or Poor Therapeutic Relationship Previous Psychiatric Diagnoses and Treatments Medical Diagnoses and Treatments/Surgeries  COGNITIVE FEATURES THAT CONTRIBUTE TO RISK:  Polarized thinking    SUICIDE RISK:   Moderate:  Frequent suicidal ideation with limited intensity, and duration, some specificity in terms of plans, no associated intent, good self-control, limited dysphoria/symptomatology, some risk factors present, and identifiable protective factors, including available and accessible social support.  PLAN OF CARE: patient is admitted voluntarily from [facility] medical floor for depression and suicidal attempt with overdose on her benzodiazepines. This is a first acute psychiatric hospitalization.   I certify that inpatient services furnished can reasonably be expected to improve the patient's condition.  Naithen Rivenburg,JANARDHAHA R. 04/10/2013, 8:41 AM

## 2013-04-11 NOTE — Progress Notes (Signed)
Psychoeducational Group Note  Date: 04/11/2013 Time:  1015  Group Topic/Focus:  Identifying Needs:   The focus of this group is to help patients identify their personal needs that have been historically problematic and identify healthy behaviors to address their needs.  Participation Level:  Active  Participation Quality:  Appropriate  Affect:  Appropriate  Cognitive:  Appropriate  Insight:  Improving  Engagement in Group:  Engaged  Additional Comments:  Found the group to be useful and that it will be applied to daily life.   Rilea Arutyunyan A 

## 2013-04-11 NOTE — Progress Notes (Signed)
Report received from S. Michels RN. Writer entered pts room and observed her lying in bed asleep with eyes closed and resp even and unlabored, no distress noted. Safety maintained with 15 min checks, will continue to monitor.

## 2013-04-11 NOTE — Progress Notes (Signed)
Adult Psychoeducational Group Note  Date:  04/11/2013 Time:  1315 Group Topic/Focus:  Self Care:   The focus of this group is to help patients understand the importance of self-care in order to improve or restore emotional, physical, spiritual, interpersonal, and financial health.  Participation Level:  Active  Participation Quality:  Appropriate, Attentive and Sharing  Affect:  Flat  Cognitive:  Alert and Appropriate  Insight: Appropriate  Engagement in Group:  Engaged  Modes of Intervention:  Activity, Clarification, Discussion, Education and Support  Additional Comments:  Pt was an active participant in the [employer]" group.  She made spontaneous responses without prompting and shared that she likes to walk, listen to music, and take bubble baths to care for herself.  Pt appeared very vested in her treatment.  Gwyndolyn Kaufman 04/11/2013, 10:42 AM

## 2013-04-11 NOTE — [facility] Group Notes (Signed)
   [facility] LCSW Group Therapy  04/11/2013   Type of Therapy:  Group Therapy 3:00 to 4:00 PM  Participation Level:  Active  Participation Quality:  Attentive and Sharing  Affect:  Depressed and Tearful  Cognitive:  Alert and Oriented  Insight:  Developing/Improving  Engagement in Therapy:  Developing/Improving  Modes of Intervention:  Discussion, Exploration, Rapport Building, Socialization and Support  Summary of Progress/Problems: Topic for group today was readiness for change and the different stages ( Pre contemplation, Contemplation, Preparation,  Action, Maintenance and reoccurrence) were then discussed and described by group. Patient was able to process how she has tried to pretend all was well and act as if she wasn't depressed while all the time her depression was deepening. Patient was also able to process feelings that she has let others (family members) down yet needs to focus on her healing as a priority. Patient received support from other group members.  Clide Dales

## 2013-04-11 NOTE — Progress Notes (Signed)
.  Psychoeducational Group Note    Date: 04/11/2013 Time:  0915   Goal Setting Purpose of Group: To be able to set a goal that is measurable and that can be accomplished in one day Participation Level:  Active  Participation Quality:  Appropriate  Affect:  Appropriate  Cognitive:  Oriented  Insight:  Improving  Engagement in Group:  Engaged  Additional Comments:  Set a reasonable goal for today of not focusing on the past.  Dione Housekeeper

## 2013-04-11 NOTE — Progress Notes (Signed)
D) Pt has attended the groups and interacts appropriately with her peers. Denies SI and HI. Rates her depression and hopelessness both as a 0. States she feels better overall and feels as though the medication is working for her. A) Given support, reassurance and praise. Praised for her participation in groups. R) Denies SI and HI.

## 2013-04-12 MED ORDER — FLUOXETINE HCL 20 MG PO CAPS
20.0000 mg | ORAL_CAPSULE | Freq: Once | ORAL | Status: AC
  Administered 2013-04-12: 20 mg via ORAL
  Filled 2013-04-12: qty 1

## 2013-04-12 MED ORDER — FLUOXETINE HCL 20 MG PO CAPS
40.0000 mg | ORAL_CAPSULE | Freq: Every day | ORAL | Status: DC
Start: 2013-04-13 — End: 2013-04-13
  Administered 2013-04-13: 40 mg via ORAL
  Filled 2013-04-12: qty 28
  Filled 2013-04-12 (×2): qty 2
  Filled 2013-04-12: qty 28

## 2013-04-12 NOTE — Progress Notes (Signed)
[facility] MD Progress Note  04/12/2013 3:01 PM [name]  MRN:  [id number] Subjective:  Patient has been feeling better since taking her medication and denied side effects. She blames her previous medication wellbutrin for her impulsive overdose and arguments with her family. She has very supportive family and visiting her daily at night. She has not have thoughts of suicide today and feels safe to be in hospital. She has thoughts of discharge and willing to work with case manager to develop disposition plan  Diagnosis:  Axis I: Major Depression, Recurrent severe  ADL's:  Intact  Sleep: Good  Appetite:  Good  Suicidal Ideation:  Patient overdosed her ativan with intent to end her life and contract for safety in hospital Homicidal Ideation:  denied AEB (as evidenced by):  Psychiatric Specialty Exam: ROS  Blood pressure 123/80, pulse 98, temperature 98 F (36.7 C), temperature source Oral, resp. rate 19, height 5\' 7"  (1.702 m), weight 109.317 kg (241 lb), last menstrual period 10/16/2006.Body mass index is 37.74 kg/(m^2).  General Appearance: Casual, Fairly Groomed and Neat  Eye Contact::  Good  Speech:  Clear and Coherent and Normal Rate  Volume:  Increased  Mood:  Anxious and Depressed  Affect:  Congruent and Depressed  Thought Process:  Coherent and Goal Directed  Orientation:  Full (Time, Place, and Person)  Thought Content:  Rumination  Suicidal Thoughts:  Yes.  without intent/plan  Homicidal Thoughts:  No  Memory:  Immediate;   Fair Recent;   Fair  Judgement:  Intact  Insight:  Present  Psychomotor Activity:  Normal  Concentration:  Good  Recall:  Good  Akathisia:  NA  Handed:  Right  AIMS (if indicated):     Assets:  Communication Skills Desire for Improvement Financial Resources/Insurance Housing Intimacy Physical Health Resilience Social Support Transportation Vocational/Educational  Sleep:  Number of Hours: 5.75   Current Medications: Current  Facility-Administered Medications  Medication Dose Route Frequency Provider Last Rate Last Dose  . acetaminophen (TYLENOL) tablet 650 mg  650 mg Oral Q6H PRN Sanjuana Kava, NP   650 mg at 04/10/13 0859  . alum & mag hydroxide-simeth (MAALOX/MYLANTA) 200-200-20 MG/5ML suspension 30 mL  30 mL Oral Q4H PRN Sanjuana Kava, NP      . clonazePAM Scarlette Calico) tablet 0.5 mg  0.5 mg Oral BID PRN Nehemiah Settle, MD   0.5 mg at 04/10/13 0953  . estrogens (conjugated) (PREMARIN) tablet 0.625 mg  0.625 mg Oral Daily Sanjuana Kava, NP   0.625 mg at 04/12/13 0818  . FLUoxetine (PROZAC) capsule 20 mg  20 mg Oral Daily Nehemiah Settle, MD   20 mg at 04/12/13 0818  . hydrochlorothiazide (HYDRODIURIL) tablet 25 mg  25 mg Oral Daily Sanjuana Kava, NP   25 mg at 04/12/13 0818  . hydrOXYzine (ATARAX/VISTARIL) tablet 25 mg  25 mg Oral Q6H PRN Sanjuana Kava, NP      . losartan (COZAAR) tablet 100 mg  100 mg Oral Daily Sanjuana Kava, NP   100 mg at 04/12/13 0818  . magnesium hydroxide (MILK OF MAGNESIA) suspension 30 mL  30 mL Oral Daily PRN Sanjuana Kava, NP      . pantoprazole (PROTONIX) EC tablet 40 mg  40 mg Oral Daily Sanjuana Kava, NP   40 mg at 04/12/13 0818  . simvastatin (ZOCOR) tablet 20 mg  20 mg Oral q1800 Sanjuana Kava, NP   20 mg at 04/11/13 1817  .  traZODone (DESYREL) tablet 50 mg  50 mg Oral QHS PRN,MR X 1 Sanjuana Kava, NP        Lab Results: No results found for this or any previous visit (from the past 48 hour(s)).  Physical Findings: AIMS: Facial and Oral Movements Muscles of Facial Expression: None, normal Lips and Perioral Area: None, normal Jaw: None, normal Tongue: None, normal,Extremity Movements Upper (arms, wrists, hands, fingers): None, normal Lower (legs, knees, ankles, toes): None, normal, Trunk Movements Neck, shoulders, hips: None, normal, Overall Severity Severity of abnormal movements (highest score from questions above): None, normal Incapacitation due to  abnormal movements: None, normal Patient's awareness of abnormal movements (rate only patient's report): No Awareness, Dental Status Current problems with teeth and/or dentures?: No Does patient usually wear dentures?: No  CIWA:    COWS:     Treatment Plan Summary: Daily contact with patient to assess and evaluate symptoms and progress in treatment Medication management  Treatment Plan/Recommendations:  1. Discussed risks and benefits of medications 2. Encouraged compliance with medication, increase prozac 40 mg QD starting in am add 20 mg x once today. 3. Monitor for adverse effects 4. Participate on unit activities  5. Disposition plans in progress 6. Crisis management and stabilization. 7. Medication management to reduce current symptoms to base line and improve the patient's overall level of functioning. 8. Treat health problems as indicated. 9. Develop treatment plan to decrease risk of relapse upon discharge and to reduce the need for readmission. 10. Psycho-social education regarding relapse prevention and self care. 11. Health care follow up as needed for medical problems.  Medical Decision Making Problem Points:  Established problem, worsening (2), Review of last therapy session (1) and Review of psycho-social stressors (1) Data Points:  Review or order clinical lab tests (1) Review of medication regiment & side effects (2) Review of new medications or change in dosage (2)  I certify that inpatient services furnished can reasonably be expected to improve the patient's condition.   Kisha Messman,JANARDHAHA R. 04/12/2013, 3:01 PM

## 2013-04-12 NOTE — Progress Notes (Signed)
D) Pt has attended the groups and partisipates fully in the program. Affect and mood are better. Denies SI and HI. Rates her depresion and hopelessness both as a 0. States she feels much more stable and bwelieves she feels ready to go home tomorrow. A) Given support and reassurance along with praise. Encouraged to continue her work that she started here outside of the hospital. R) Denies SI and HI.

## 2013-04-12 NOTE — [facility] Group Notes (Signed)
[facility] LCSW Group Therapy  04/12/2013   3:00 PM   Type of Therapy:  Group Therapy  Participation Level:  Active  Participation Quality:  Appropriate and Attentive  Affect:  Appropriate  Cognitive:  Alert and Appropriate  Insight:  Developing/Improving and Engaged  Engagement in Therapy:  Developing/Improving and Engaged  Modes of Intervention:  Clarification, Confrontation, Discussion, Education, Exploration, Limit-setting, Orientation, Problem-solving, Rapport Building, Dance movement psychotherapist, Socialization and Support  Summary of Progress/Problems: The main focus of today's process group was to identify the patient's current support system and decide on other supports that can be put in place.  An emphasis was placed on using counselor, doctor, therapy groups, 12-step groups, and problem-specific support groups to expand supports, as well as doing something different than has been done before. Pt shared that her husband, son and parents are supportive and noticed her symptoms before she did.  Pt states that this kind of support was helpful while she was in denial about her symptoms of depression, which led to a suicide attempt.  Pt actively participated and was engaged in group discussion.     Reyes Ivan, LCSWA 04/12/2013 1:54 PM

## 2013-04-12 NOTE — Progress Notes (Signed)
Psychoeducational Group Note  Psychoeducational Group Note  Date: 04/12/2013 Time:  04/12/2013  Group Topic/Focus:  Gratefulness:  The focus of this group is to help patients identify what two things they are most grateful for in their lives. What helps ground them and to center them on their work to their recovery.  Participation Level:  Active  Participation Quality:  Appropriate  Affect:  Angry and Appropriate  Cognitive:  Appropriate  Insight:  Improving  Engagement in Group:  Engaged  Additional Comments:  Pt participated fully in group. States that she is grateful to be alive, to have family and to finally feel back to her old self. Dione Housekeeper

## 2013-04-12 NOTE — Progress Notes (Signed)
Writer observed patient up in the dayroom interacting appropriately with peers. Patient attended group and participated, she reports that her husband and son visited and they discussed how thing will be done differently at home as well as communicating differently. Patient hopeful to discharge on tomorrow. Support and encouragement offered, she denies si/hi/a/v hallucinations. Safety maintained on unit, will continue to monitor

## 2013-04-12 NOTE — Progress Notes (Signed)
Psychoeducational Group Note  Date:  04/12/2013 Time:  1015  Group Topic/Focus:  Making Healthy Choices:   The focus of this group is to help patients identify negative/unhealthy choices they were using prior to admission and identify positive/healthier coping strategies to replace them upon discharge.  Participation Level:  Active  Participation Quality:  Appropriate  Affect:  Appropriate  Cognitive:  Oriented  Insight:  Engaged and Improving  Engagement in Group:  Engaged  Additional Comments:    Martina Brodbeck A 04/12/2013 

## 2013-04-13 MED ORDER — LOSARTAN POTASSIUM-HCTZ 100-25 MG PO TABS: 1.0000 | ORAL_TABLET | Freq: Every day | ORAL | Status: DC

## 2013-04-13 MED ORDER — HYDROCHLOROTHIAZIDE 25 MG PO TABS: 25.0000 mg | ORAL_TABLET | Freq: Every day | ORAL | Status: DC

## 2013-04-13 MED ORDER — PANTOPRAZOLE SODIUM 40 MG PO TBEC: 40.0000 mg | DELAYED_RELEASE_TABLET | Freq: Every day | ORAL | Status: DC

## 2013-04-13 MED ORDER — FLUOXETINE HCL 40 MG PO CAPS: 40.0000 mg | ORAL_CAPSULE | Freq: Every day | ORAL | Status: DC

## 2013-04-13 NOTE — [facility] Group Notes (Signed)
[facility] LCSW Aftercare Discharge Planning Group Note   04/13/2013 10:16 AM  Participation Quality:  Appropriate  Mood/Affect:  Appropriate  Depression Rating:  0  Anxiety Rating:  0  Thoughts of Suicide:  No  Will you contract for safety?   NA  Current AVH:  No  Plan for Discharge/Comments: Patient reports doing well and being ready to discharge home today.  Transportation Means: Patient has transportation.  Supports:  Patient has a good support system.   [name], Joesph July

## 2013-04-13 NOTE — [facility] Suicide Risk Assessment (Signed)
[facility] INPATIENT:  Family/Significant Other Suicide Prevention Education  Suicide Prevention Education:  Education Completed; [name], [phone]; has been identified by the patient as the family member/significant other with whom the patient will be residing, and identified as the person(s) who will aid the patient in the event of a mental health crisis (suicidal ideations/suicide attempt).  With written consent from the patient, the family member/significant other has been provided the following suicide prevention education, prior to the and/or following the discharge of the patient.  The suicide prevention education provided includes the following:  Suicide risk factors  Suicide prevention and interventions  National Suicide Hotline telephone number  [facility] assessment telephone number  [facility] Emergency Assistance 911  [facility] and/or Residential Mobile Crisis Unit telephone number  Request made of family/significant other to:  Remove weapons (e.g., guns, rifles, knives), all items previously/currently identified as safety concern.  Husband reports guns will be secured.  Remove drugs/medications (over-the-counter, prescriptions, illicit drugs), all items previously/currently identified as a safety concern.  The family member/significant other verbalizes understanding of the suicide prevention education information provided.  The family member/significant other agrees to remove the items of safety concern listed above.  [name] 04/13/2013, 11:50 AM

## 2013-04-13 NOTE — Progress Notes (Signed)
[facility] Adult Case Management Discharge Plan :  Will you be returning to the same living situation after discharge: Yes,  Patient is returning to her home. At discharge, do you have transportation home?:Yes,  Patient has transportation. Do you have the ability to pay for your medications:Yes,  Patient is able to afford medications.  Release of information consent forms completed and in the chart;  Patient's signature needed at discharge.  Patient to Follow up at: Follow-up Information   Follow up with [name]  [facility] On 04/21/2013. (You are scheduled with [name] on Monday, April 20, 2013 at 4:00.  Please arrive at 3:30 to complete registration)    Contact information:   [address] [place], [place]   [address]  [phone]      Patient denies SI/HI:   Patient no longer endorsing SI/HI or other thoughts of self harm.     Safety Planning and Suicide Prevention discussed: .Reviewed with all patients during discharge planning group  [name], [name] July 04/13/2013, 11:46 AM

## 2013-04-13 NOTE — Progress Notes (Signed)
D/C instructions/meds/follow-up appointments reviewed, pt verbalized understanding, pt's belongings returned to pt, samples given. 

## 2013-04-13 NOTE — Progress Notes (Signed)
Adult Psychoeducational Group Note  Date:  04/13/2013 Time:  3:55 PM  Group Topic/Focus:  Self Care:   The focus of this group is to help patients understand the importance of self-care in order to improve or restore emotional, physical, spiritual, interpersonal, and financial health.  Participation Level:  Active  Participation Quality:  Appropriate, Attentive and Sharing  Affect:  Appropriate  Cognitive:  Appropriate  Insight: Appropriate and Good  Engagement in Group:  Developing/Improving and Engaged  Modes of Intervention:  Discussion, Education, Socialization and Support  Additional Comments:  [name] attended and shared during group. Patient define self-care in her own terms. Patient was asked to complete the self care assessment in workbook for daily theme, patient completed assessment and rated the areas of care of physical, psychological, emotional, spiritual, relationship care and was asked to give weaknesses and strengths in the areas. Patient also expressed ways to improve care and asked to set a goal for the areas of weaknesses.  [name] Brittini 04/13/2013, 3:55 PM

## 2013-04-13 NOTE — Progress Notes (Signed)
Adult Psychoeducational Group Note  Date:  04/12/2013  Time:  20:00  Group Topic/Focus:  Wrap-Up Group:   The focus of this group is to help patients review their daily goal of treatment and discuss progress on daily workbooks.  Participation Level:  Active  Participation Quality:  Appropriate  Affect:  Appropriate  Cognitive:  Appropriate  Insight: Appropriate  Engagement in Group:  Engaged  Modes of Intervention:  Discussion Additional Comments:  Patient goals today was not to have negative thoughts and communicate better and overall has reached her goals.  [name] 04/13/2013, 3:53 AM

## 2013-04-13 NOTE — Plan of Care (Signed)
Problem: Alteration in mood Goal: LTG-Pt's behavior demonstrates decreased signs of depression (Patient's behavior demonstrates decreased signs of depression to the point the patient is safe to return home and continue treatment in an outpatient setting)  Outcome: Completed/Met Date Met:  04/13/13 Patient is rating depression at zero.  She has attended groups and reports being ready to discharge home.

## 2013-04-13 NOTE — Plan of Care (Signed)
Problem: Ineffective individual coping Goal: STG: Patient will participate in after care plan Outcome: Completed/Met Date Met:  04/13/13 Patient has attended groups and easily engaged in discussions.  Outpatient follow up is scheduled with [facility]. [employer], LCSW 04/13/2013

## 2013-04-13 NOTE — Tx Team (Signed)
Interdisciplinary Treatment Plan Update   Date Reviewed:  04/13/2013  Time Reviewed:  9:58 AM  Progress in Treatment:   Attending groups: Yes Participating in groups: Yes Taking medication as prescribed: Yes  Tolerating medication: Yes Family/Significant other contact made: Yes, contact made with husband Discussing patient identified problems/goals with staff: Yes Medical problems stabilized or resolved: Yes Denies suicidal/homicidal ideation: Yes Patient has not harmed self or others: Yes  For review of initial/current patient goals, please see plan of care.  Estimated Length of Stay: Discharge home today  Reasons for Continued Hospitalization:   New Problems/Goals identified:    Discharge Plan or Barriers:   Home with outpatient follow up  Additional Comments: N/A  Attendees:  Patient: [name] 04/13/2013 9:58 AM   Signature: Mervyn Gay, MD 04/13/2013 9:58 AM  Signature: 04/13/2013 9:58 AM  Signature: Dellia Cloud, RN 04/13/2013 9:58 AM  Signature:Beverly Terrilee Croak, RN 04/13/2013 9:58 AM  Signature:   04/13/2013 9:58 AM  Signature:  Juline Patch, LCSW 04/13/2013 9:58 AM  Signature:  Reyes Ivan, LCSW 04/13/2013 9:58 AM  Signature:  Sharin Grave Coordinator 04/13/2013 9:58 AM  Signature: Fransisca Kaufmann, [facility] 04/13/2013 9:58 AM  Signature:    Signature:    Signature:      Scribe for Treatment Team:   Juline Patch,  04/13/2013 9:58 AM

## 2013-04-13 NOTE — Progress Notes (Signed)
Patient has been up and active on the unit, attended group this evening and participated. She has voiced no complaints. Patient hopeful to discharge on tomorrow. Patient currently denies having pain, -si/hi/a/v hall. Support and encouragement offered, safety maintained on unit, will continue to monitor.

## 2013-04-13 NOTE — Discharge Summary (Signed)
Physician Discharge Summary Note  Patient:  [name] is an 42 y.o., female MRN:  [id number] DOB:  06-17-1971 Patient phone:  [phone] (home)  Patient address:   [address] [place] [address],   Date of Admission:  04/09/2013 Date of Discharge: 04/13/13  Reason for Admission:  Depression with suicide attempt  Discharge Diagnoses: Active Problems:   * No active hospital problems. *  Review of Systems  Constitutional: Negative.   HENT: Negative.   Eyes: Negative.   Respiratory: Negative.   Cardiovascular: Negative.   Gastrointestinal: Negative.   Genitourinary: Negative.   Musculoskeletal: Negative.   Skin: Negative.   Neurological: Negative.   Endo/Heme/Allergies: Negative.   Psychiatric/Behavioral: Negative for depression, suicidal ideas, hallucinations, memory loss and substance abuse. The patient is nervous/anxious. The patient does not have insomnia.    Axis Diagnosis:   AXIS I:  Generalized Anxiety Disorder and Major Depression, Recurrent severe AXIS II:  Deferred AXIS III:   Past Medical History  Diagnosis Date  . Hypertension   . Peptic ulcer disease   . GERD (gastroesophageal reflux disease)   . Cervical cancer   . Depression   . Hyperlipidemia   . Urinary incontinence    AXIS IV:  other psychosocial or environmental problems and problems related to social environment AXIS V:  61-70 mild symptoms  Level of Care:  OP  Hospital Course: [name] is an 42 y.o. female that was reassessed as she is being admitted to [facility] as an adult inpatient to Dr. Elsie Saas. Pt is tearful as she expresses confusion over "how I even got here." Since her overdose on Tuesday, pt cannot recall the events that happened afterwards up until "around 4am this morning." Pt voices feeling that her medication adjustment was a contributing factor to her overdose, but tearfully expresses how she feels "overwhelmed with it all." Pt was concerned about the  admission policy, but would neither confirm nor deny if she remains suicidal or able to contract for safety. Pt does report ongoing and worsening depression and anxiety.       The duration of stay was four days. The patient was seen and evaluated by the Treatment team consisting of Psychiatrist, NP-C, RN, Case Manager, and Therapist for evaluation and treatment plan with goal of stabilization upon discharge. The patient's physical and mental health problems were identified and treated appropriately.      Multiple modalities of treatment were used including medication, individual and group therapies, unit programming, improved nutrition, physical activity, and family sessions as needed. Patient's medications for high cholesterol, high blood pressure and estrogen deficiency were continued upon admission to the hospital. Patient had been taking Wellbutrin 300 mg daily prior to admission and felt this medicine had not been working well for her. She reported having had a better response to Prozac in the past and was started on 20 mg daily. Patient was also ordered Trazodone 50 mg at bedtime as needed for sleep.      The symptoms of depression were monitored daily by evaluation by clinical provider.  The patient's mental and emotional status was evaluated by a daily self inventory completed by the patient. The patient's family visited during her admission and appeared supportive. The patient was open in communicating with her husband how communication could improve after she comes back home.       Improvement was demonstrated by declining numbers on the self assessment, improving vital signs, increased cognition, and improvement in mood, sleep, appetite as  well as a reduction in physical symptoms.       The patient was evaluated and found to be stable enough for discharge and was released to home per the initial plan of treatment. [name] spoke to the treatment team prior to her discharge and denied any SI. Patient  stated "I feel like myself again. I don't think about hurting myself anymore. My depression and anxiety are zero. I will never take wellbutrin again as it made me manic and not think clearly. I feel good about my current medications." The patient received prescriptions for her new medications and sample supply. She felt like she had learned new coping skills that will help her cope better after discharge with stressors.   Mental Status Exam:  For mental status exam please see mental status exam and  suicide risk assessment completed by attending physician prior to discharge.  Consults:  None  Significant Diagnostic Studies:  labs: Chem profile, CBC with diff  Discharge Vitals:   Blood pressure 125/85, pulse 101, temperature 98 F (36.7 C), temperature source Oral, resp. rate 20, height 5\' 7"  (1.702 m), weight 109.317 kg (241 lb), last menstrual period 10/16/2006. Body mass index is 37.74 kg/(m^2). Lab Results:   No results found for this or any previous visit (from the past 72 hour(s)).  Physical Findings: AIMS: Facial and Oral Movements Muscles of Facial Expression: None, normal Lips and Perioral Area: None, normal Jaw: None, normal Tongue: None, normal,Extremity Movements Upper (arms, wrists, hands, fingers): None, normal Lower (legs, knees, ankles, toes): None, normal, Trunk Movements Neck, shoulders, hips: None, normal, Overall Severity Severity of abnormal movements (highest score from questions above): None, normal Incapacitation due to abnormal movements: None, normal Patient's awareness of abnormal movements (rate only patient's report): No Awareness, Dental Status Current problems with teeth and/or dentures?: No Does patient usually wear dentures?: No  CIWA:    COWS:     Psychiatric Specialty Exam: See Psychiatric Specialty Exam and Suicide Risk Assessment completed by Attending Physician prior to discharge.  Discharge destination:  Home  Is patient on multiple  antipsychotic therapies at discharge:  No   Has Patient had three or more failed trials of antipsychotic monotherapy by history:  No  Recommended Plan for Multiple Antipsychotic Therapies: N/A     Medication List    STOP taking these medications       acetaminophen 500 MG tablet  Commonly known as:  TYLENOL     buPROPion 300 MG 24 hr tablet  Commonly known as:  WELLBUTRIN XL     solifenacin 5 MG tablet  Commonly known as:  VESICARE      TAKE these medications     Indication   esomeprazole 40 MG capsule  Commonly known as:  NEXIUM  Take 40 mg by mouth daily before breakfast.      estrogens (conjugated) 0.625 MG tablet  Commonly known as:  PREMARIN  Take 0.625 mg by mouth daily.      FLUoxetine 40 MG capsule  Commonly known as:  PROZAC  Take 1 capsule (40 mg total) by mouth daily.   Indication:  Depression     hydrochlorothiazide 25 MG tablet  Commonly known as:  HYDRODIURIL  Take 1 tablet (25 mg total) by mouth daily.   Indication:  High Blood Pressure     losartan-hydrochlorothiazide 100-25 MG per tablet  Commonly known as:  HYZAAR  Take 1 tablet by mouth daily.   Indication:  High Blood Pressure     pantoprazole  40 MG tablet  Commonly known as:  PROTONIX  Take 1 tablet (40 mg total) by mouth daily.   Indication:  Gastroesophageal Reflux Disease     simvastatin 20 MG tablet  Commonly known as:  ZOCOR  TAKE 1 TABLET BY MOUTH EVERY EVENING            Follow-up Information   Follow up with    [facility]. (You are scheduled with     )    Contact information:   [address] [place], [place]   [address]  [phone]      Follow-up recommendations:  Activity:  As tolerated Diet:  Regular  Comments:  Take all your medications as prescribed by your mental healthcare provider.  Report any adverse effects and or reactions from your medicines to your outpatient provider promptly.  Patient is instructed and cautioned to not engage in  alcohol and or illegal drug use while on prescription medicines.  In the event of worsening symptoms, patient is instructed to call the crisis hotline, 911 and or go to the nearest ED for appropriate evaluation and treatment of symptoms.  Follow-up with your primary care provider for your other medical issues, concerns and or health care needs.   Total Discharge Time:  Greater than 30 minutes.  SignedFransisca Kaufmann NP-C 04/13/2013, 10:56 AM  Patient was personally seen, case discussed in detail with physician extender. Reviewed the information documented and agree with the discharge treatment plan.   Tremond Shimabukuro,JANARDHAHA R. 04/13/2013 7:06 PM

## 2013-04-13 NOTE — Progress Notes (Signed)
Grief and Loss Group   Group members processed their feelings and experiences with significant losses in their lives.   Pt did not share about any of her personal experiences, but normalized the experience of loss and offered support, comfort and encouragement to another group member.   [name]  Counselor Intern  Haroldine Laws

## 2013-04-13 NOTE — [facility] Suicide Risk Assessment (Signed)
Suicide Risk Assessment  Discharge Assessment     Demographic Factors:  Adolescent or young adult and Caucasian  Mental Status Per Nursing Assessment::   On Admission:  NA  Current Mental Status by Physician: Mental Status Examination: Patient appeared as per his stated age, casually dressed, and fairly groomed, and maintaining good eye contact. Patient has good mood and his affect was constricted. He has normal rate, rhythm, and volume of speech. His thought process is linear and goal directed. Patient has denied suicidal, homicidal ideations, intentions or plans. Patient has no evidence of auditory or visual hallucinations, delusions, and paranoia. Patient has fair insight judgment and impulse control.  Loss Factors: Decrease in vocational status and Decline in physical health  Historical Factors: Impulsivity  Risk Reduction Factors:   Sense of responsibility to family, Religious beliefs about death, Employed, Living with another person, especially a relative, Positive social support, Positive therapeutic relationship and Positive coping skills or problem solving skills  Continued Clinical Symptoms:  Depression:   Anhedonia Impulsivity Insomnia Recent sense of peace/wellbeing Severe Previous Psychiatric Diagnoses and Treatments  Cognitive Features That Contribute To Risk:  Closed-mindedness Polarized thinking    Suicide Risk:  Minimal: No identifiable suicidal ideation.  Patients presenting with no risk factors but with morbid ruminations; may be classified as minimal risk based on the severity of the depressive symptoms  Discharge Diagnoses:   AXIS I:  Major Depression, Recurrent severe AXIS II:  Deferred AXIS III:   Past Medical History  Diagnosis Date  . Hypertension   . Peptic ulcer disease   . GERD (gastroesophageal reflux disease)   . Cervical cancer   . Depression   . Hyperlipidemia   . Urinary incontinence    AXIS IV:  occupational problems, other  psychosocial or environmental problems, problems related to social environment and problems with primary support group AXIS V:  61-70 mild symptoms  Plan Of Care/Follow-up recommendations:  Activity:  As tolerated Diet:  Regular  Is patient on multiple antipsychotic therapies at discharge:  No   Has Patient had three or more failed trials of antipsychotic monotherapy by history:  No  Recommended Plan for Multiple Antipsychotic Therapies: Not applicable  [name],Alexandra R. 04/13/2013, 1:04 PM

## 2013-04-16 NOTE — Progress Notes (Signed)
Patient Discharge Instructions:  After Visit Summary (AVS):   Faxed to:  04/16/13 Discharge Summary Note:   Faxed to:  04/16/13 Psychiatric Admission Assessment Note:   Faxed to:  04/16/13 Suicide Risk Assessment - Discharge Assessment:   Faxed to:  04/16/13 Faxed/Sent to the Next Level Care provider:  04/16/13 Faxed to [facility] @ [phone]  Jerelene Redden, 04/16/2013, 3:14 PM

## 2013-06-08 ENCOUNTER — Other Ambulatory Visit (HOSPITAL_COMMUNITY): Payer: Self-pay | Admitting: *Deleted

## 2013-06-08 MED ORDER — LOSARTAN POTASSIUM-HCTZ 100-25 MG PO TABS: 1.0000 | ORAL_TABLET | Freq: Every day | ORAL | Status: DC

## 2013-08-10 ENCOUNTER — Ambulatory Visit: Payer: 59 | Admitting: Podiatry

## 2013-08-27 ENCOUNTER — Other Ambulatory Visit: Payer: Self-pay

## 2014-04-16 ENCOUNTER — Other Ambulatory Visit: Payer: Self-pay | Admitting: Cardiovascular Disease

## 2014-04-16 MED ORDER — ALBUTEROL SULFATE HFA 108 (90 BASE) MCG/ACT IN AERS: 2.0000 | INHALATION_SPRAY | Freq: Four times a day (QID) | RESPIRATORY_TRACT | Status: DC | PRN

## 2014-04-16 NOTE — Progress Notes (Signed)
[name] is having a severe cough with wheezing. Will send in script for albuterol.  Thayer Headings, Brooke Bonito., MD, [facility] 04/16/2014, 2:44 PM [address],  [place] Pager [phone]

## 2015-02-14 ENCOUNTER — Telehealth: Payer: Self-pay | Admitting: *Deleted

## 2015-02-14 NOTE — Telephone Encounter (Signed)
TOC, being dcd to SNF today. STEMI & CVA. Will see Gerald Stabs on 5/5 @ 9am.

## 2015-02-15 NOTE — Telephone Encounter (Signed)
Please disregard note entered on 02/14/15 at 4:48 pm. This was entered in error on this patient's chart. This patient does not have TOC , being dcd to SNF today. STEMI & CVA. She has no appointment with Gerald Stabs on 5/5 @ 9am. Note was unable to be addended.

## 2015-10-31 ENCOUNTER — Encounter (HOSPITAL_COMMUNITY): Payer: Self-pay | Admitting: Family Medicine

## 2015-10-31 ENCOUNTER — Emergency Department (HOSPITAL_COMMUNITY)
Admission: EM | Admit: 2015-10-31 | Discharge: 2015-10-31 | Disposition: A | Discharge status: Home / Self Care | Payer: PRIVATE HEALTH INSURANCE | Attending: Emergency Medicine | Admitting: Emergency Medicine

## 2015-10-31 DIAGNOSIS — Z79818 Long term (current) use of other agents affecting estrogen receptors and estrogen levels: Secondary | ICD-10-CM | POA: Insufficient documentation

## 2015-10-31 DIAGNOSIS — S0031XA Abrasion of nose, initial encounter: Secondary | ICD-10-CM | POA: Diagnosis not present

## 2015-10-31 DIAGNOSIS — E785 Hyperlipidemia, unspecified: Secondary | ICD-10-CM | POA: Insufficient documentation

## 2015-10-31 DIAGNOSIS — F329 Major depressive disorder, single episode, unspecified: Secondary | ICD-10-CM | POA: Diagnosis not present

## 2015-10-31 DIAGNOSIS — Y9301 Activity, walking, marching and hiking: Secondary | ICD-10-CM | POA: Insufficient documentation

## 2015-10-31 DIAGNOSIS — I10 Essential (primary) hypertension: Secondary | ICD-10-CM | POA: Insufficient documentation

## 2015-10-31 DIAGNOSIS — Y9289 Other specified places as the place of occurrence of the external cause: Secondary | ICD-10-CM | POA: Insufficient documentation

## 2015-10-31 DIAGNOSIS — Z9889 Other specified postprocedural states: Secondary | ICD-10-CM | POA: Insufficient documentation

## 2015-10-31 DIAGNOSIS — Y998 Other external cause status: Secondary | ICD-10-CM | POA: Insufficient documentation

## 2015-10-31 DIAGNOSIS — Z79899 Other long term (current) drug therapy: Secondary | ICD-10-CM | POA: Insufficient documentation

## 2015-10-31 DIAGNOSIS — S0990XA Unspecified injury of head, initial encounter: Secondary | ICD-10-CM | POA: Diagnosis not present

## 2015-10-31 DIAGNOSIS — K219 Gastro-esophageal reflux disease without esophagitis: Secondary | ICD-10-CM | POA: Diagnosis not present

## 2015-10-31 DIAGNOSIS — W000XXA Fall on same level due to ice and snow, initial encounter: Secondary | ICD-10-CM | POA: Diagnosis not present

## 2015-10-31 DIAGNOSIS — S00511A Abrasion of lip, initial encounter: Secondary | ICD-10-CM | POA: Diagnosis not present

## 2015-10-31 DIAGNOSIS — Z8541 Personal history of malignant neoplasm of cervix uteri: Secondary | ICD-10-CM | POA: Diagnosis not present

## 2015-10-31 DIAGNOSIS — W19XXXA Unspecified fall, initial encounter: Secondary | ICD-10-CM

## 2015-10-31 DIAGNOSIS — Z8711 Personal history of peptic ulcer disease: Secondary | ICD-10-CM | POA: Diagnosis not present

## 2015-10-31 DIAGNOSIS — S0993XA Unspecified injury of face, initial encounter: Secondary | ICD-10-CM | POA: Diagnosis present

## 2015-10-31 DIAGNOSIS — S0181XA Laceration without foreign body of other part of head, initial encounter: Secondary | ICD-10-CM | POA: Insufficient documentation

## 2015-10-31 DIAGNOSIS — IMO0002 Reserved for concepts with insufficient information to code with codable children: Secondary | ICD-10-CM

## 2015-10-31 MED ORDER — LIDOCAINE HCL (PF) 1 % IJ SOLN
INTRAMUSCULAR | Status: AC
  Filled 2015-10-31: qty 5

## 2015-10-31 MED ORDER — ACETAMINOPHEN 325 MG PO TABS
650.0000 mg | ORAL_TABLET | Freq: Once | ORAL | Status: AC
  Administered 2015-10-31: 650 mg via ORAL
  Filled 2015-10-31: qty 2

## 2015-10-31 MED ORDER — LIDOCAINE HCL (PF) 1 % IJ SOLN: 5.0000 mL | Freq: Once | INTRAMUSCULAR | Status: DC

## 2015-10-31 MED FILL — VENTOLIN HFA 90 MCG INHALER: 108 (90 BAS | 16 days supply | Qty: 18 | Fill #0

## 2015-10-31 NOTE — ED Provider Notes (Signed)
CSN: [id number]     Arrival date & time 10/31/15  1713 History  By signing my name below, I, Eustaquio Maize, attest that this documentation has been prepared under the direction and in the presence of Harlene Ramus, PA-C. Electronically Signed: Eustaquio Maize, ED Scribe. 10/31/2015. 6:28 PM.   Chief Complaint  Patient presents with  . Fall  . Facial Injury   The history is provided by the patient. No language interpreter was used.     HPI Comments: [name] is a 45 y.o. female who presents to the Emergency Department complaining of sudden onset, constant, 7/10, diffuse headache and facial pain s/p ground level fall that occurred PTA. Pt was leaving work when she slipped on ice, and fell forward landing onto her face. No LOC. There are no modifying factors to the headache. She has not taken anything for her pain. She has a small laceration to her chin and complains of mild numbness around the area. Denies blurry vision, splotchy vision, lightheadedness, neck pain, back pain, shortness of breath, abdominal pain, nausea, vomiting, weakness, tingling, epistaxis, or any other associated symptoms. Tetanus up to date.   Past Medical History  Diagnosis Date  . Hypertension   . Peptic ulcer disease   . GERD (gastroesophageal reflux disease)   . Cervical cancer ([place])   . Depression   . Hyperlipidemia   . Urinary incontinence    Past Surgical History  Procedure Laterality Date  . Total abdominal hysterectomy    . Combined hysterectomy abdominal w/ mmk / burch procedure    . Endometrial ablation  12/01/2004    Archie Endo 03/07/2011 (04/07/2013)  . Cesarean section    . Cardiac catheterization    . Laparoscopic cholecystectomy  01/26/2004    Archie Endo 03/07/2011 (04/07/2013)  . Tubal ligation  08/21/2001    Archie Endo 03/07/2011 (04/07/2013)   Family History  Problem Relation Age of Onset  . Colon cancer Paternal Grandmother   . Heart attack Mother   . Breast cancer Paternal Aunt     x 3  . Uterine  cancer Maternal Grandmother   . Heart disease Father   . Lung cancer Maternal Grandfather   . Breast cancer Maternal Aunt     x 3   Social History  Substance Use Topics  . Smoking status: Never Smoker   . Smokeless tobacco: Never Used  . Alcohol Use: No   OB History    No data available     Review of Systems  HENT: Negative for nosebleeds.        + Facial pain  Eyes: Negative for visual disturbance.  Respiratory: Negative for shortness of breath.   Gastrointestinal: Negative for nausea, vomiting and abdominal pain.  Musculoskeletal: Negative for back pain and neck pain.  Skin: Positive for wound.  Neurological: Positive for numbness (To chin) and headaches. Negative for syncope, weakness and light-headedness.   Allergies  Aspirin; Neurontin; and Ace inhibitors  Home Medications   Prior to Admission medications   Medication Sig Start Date End Date Taking? Authorizing Provider  albuterol (PROVENTIL HFA;VENTOLIN HFA) 108 (90 BASE) MCG/ACT inhaler Inhale 2 puffs into the lungs every 6 (six) hours as needed for wheezing or shortness of breath. 04/16/14   Thayer Headings, MD  esomeprazole (NEXIUM) 40 MG capsule Take 40 mg by mouth daily before breakfast.    Historical Provider, MD  estrogens, conjugated, (PREMARIN) 0.625 MG tablet Take 0.625 mg by mouth daily.     Historical Provider, MD  FLUoxetine (  PROZAC) 40 MG capsule Take 1 capsule (40 mg total) by mouth daily. 04/13/13   Ruben Im, PA-C  losartan-hydrochlorothiazide (HYZAAR) 100-25 MG per tablet Take 1 tablet by mouth daily. 06/08/13   Jolaine Artist, MD  pantoprazole (PROTONIX) 40 MG tablet Take 1 tablet (40 mg total) by mouth daily. 04/13/13   Ruben Im, PA-C  simvastatin (ZOCOR) 20 MG tablet TAKE 1 TABLET BY MOUTH EVERY EVENING 09/03/12   Jolaine Artist, MD   Triage Vitals:  BP 151/70 mmHg  Pulse 80  Resp 18  SpO2 98%  LMP 10/16/2006   Physical Exam  Constitutional: She is oriented to person, place,  and time. She appears well-developed and well-nourished. No distress.  HENT:  Head: Normocephalic. Head is with abrasion.  Right Ear: Tympanic membrane normal. No hemotympanum.  Left Ear: Tympanic membrane normal. No hemotympanum.  Nose: Nose normal. No epistaxis. Right sinus exhibits no maxillary sinus tenderness and no frontal sinus tenderness. Left sinus exhibits no maxillary sinus tenderness and no frontal sinus tenderness.  Mouth/Throat: Uvula is midline, oropharynx is clear and moist and mucous membranes are normal.  Small abrasions noted to superior nasal bridge and superior upper lip Lower lip abrasion with no active bleeding 0.5 cm laceration noted to upper chin; no active bleeding  Eyes: Conjunctivae and EOM are normal. Pupils are equal, round, and reactive to light.  Neck: Normal range of motion. Neck supple. No tracheal deviation present.  Cardiovascular: Normal rate, regular rhythm, normal heart sounds and intact distal pulses.   Pulmonary/Chest: Effort normal and breath sounds normal. No respiratory distress.  Abdominal: Soft. Bowel sounds are normal. She exhibits no distension. There is no tenderness.  Musculoskeletal: Normal range of motion.  Neurological: She is alert and oriented to person, place, and time. She has normal strength. No cranial nerve deficit or sensory deficit. She displays a negative Romberg sign. Coordination and gait normal.  Skin: Skin is warm and dry.  Psychiatric: She has a normal mood and affect. Her behavior is normal.  Nursing note and vitals reviewed.   ED Course  Procedures (including critical care time)  LACERATION REPAIR PROCEDURE NOTE The patient's identification was confirmed and consent was obtained. This procedure was performed by Harlene Ramus, PA-C at 6:14 PM. Site: Upper chin Sterile procedures observed Anesthetic used (type and amt): 1% Lidocaine Suture type/size:5-0 Prolene Length: 0.5 cm # of Sutures: 1 Technique: Simple  interruppted Complexity: Simple Antibx ointment applied Tetanus UTD or ordered: UTD Site anesthetized, irrigated with NS, explored without evidence of foreign body, wound well approximated, site covered with dry, sterile dressing.  Patient tolerated procedure well without complications. Instructions for care discussed verbally and patient provided with additional written instructions for homecare and f/u.   DIAGNOSTIC STUDIES: Oxygen Saturation is 98% on RA, normal by my interpretation.    COORDINATION OF CARE: 5:50 PM-Discussed treatment plan which includes cleaning up wound and suture placement with pt at bedside and pt agreed to plan.   Labs Review Labs Reviewed - No data to display  Imaging Review No results found.  Filed Vitals:   10/31/15 1721  BP: 151/70  Pulse: 80  Resp: 18     MDM   Final diagnoses:  Fall, initial encounter  Laceration   Patient presents with facial injury status post fall from upright position after slipping on ice. Denies LOC. Bleeding controlled. Patient denies use of anticoagulants. VSS. Exam revealed multiple small abrasions to face with no active bleeding, small laceration noted  to upper chin. Wounds cleaned and irrigated and suture placed on chin laceration without any complications. Discussed wound care with patient and advised her to return to the ED or be seen by her PCP in 5 days for suture removal.  Evaluation does not show pathology requring ongoing emergent intervention or admission. Pt is hemodynamically stable and mentating appropriately. Discussed findings/results and plan with patient/guardian, who agrees with plan. All questions answered. Return precautions discussed and outpatient follow up given.    I personally performed the services described in this documentation, which was scribed in my presence. The recorded information has been reviewed and is accurate.      Chesley Noon [place], [place] 10/31/15 1835  Dorie Rank,  MD 11/01/15 Dyann Kief

## 2015-10-31 NOTE — ED Notes (Signed)
Pt here for fall and injury to face. sts walking to her car and slipped. Lac noted to lip and some abrasions. Denies any vision problems, nausea, headache, dizziness.

## 2015-10-31 NOTE — Discharge Instructions (Signed)
Keep wound clean with soap and water and dry. You may place a small amount of over-the-counter antibiotic ointment on the wound. Return to the emergency department in 5 days for suture removal. Return to the emergency department if symptoms worsen or new onset of fever, redness, swelling, drainage.

## 2015-11-24 ENCOUNTER — Other Ambulatory Visit: Payer: Self-pay

## 2015-11-24 DIAGNOSIS — Z1231 Encounter for screening mammogram for malignant neoplasm of breast: Secondary | ICD-10-CM

## 2015-11-24 MED FILL — ESOMEPRAZOLE MAG DR 40 MG C: 40 | 90 days supply | Qty: 90 | Fill #2

## 2015-11-24 MED FILL — FLUoxetine HCL 40 MG CAPS: 40 | 90 days supply | Qty: 90 | Fill #0

## 2015-12-06 DIAGNOSIS — L812 Freckles: Secondary | ICD-10-CM | POA: Diagnosis not present

## 2015-12-06 DIAGNOSIS — L905 Scar conditions and fibrosis of skin: Secondary | ICD-10-CM | POA: Diagnosis not present

## 2015-12-06 DIAGNOSIS — L821 Other seborrheic keratosis: Secondary | ICD-10-CM | POA: Diagnosis not present

## 2015-12-06 DIAGNOSIS — L814 Other melanin hyperpigmentation: Secondary | ICD-10-CM | POA: Diagnosis not present

## 2015-12-26 MED FILL — LOSARTAN-HCTZ 100-25 MG TAB: 100-25 | 90 days supply | Qty: 90 | Fill #2

## 2015-12-26 MED FILL — SIMVASTATIN 20 MG TABLET: 20 | 90 days supply | Qty: 90 | Fill #2

## 2016-01-09 MED FILL — PREMARIN 0.625 MG TABLET: 0.625 | 90 days supply | Qty: 90 | Fill #3

## 2016-01-10 ENCOUNTER — Ambulatory Visit
Admission: RE | Admit: 2016-01-10 | Discharge: 2016-01-10 | Disposition: A | Discharge status: Home / Self Care | Payer: 59 | Source: Ambulatory Visit

## 2016-01-10 DIAGNOSIS — Z1231 Encounter for screening mammogram for malignant neoplasm of breast: Secondary | ICD-10-CM

## 2016-01-10 DIAGNOSIS — Z6841 Body Mass Index (BMI) 40.0 and over, adult: Secondary | ICD-10-CM | POA: Diagnosis not present

## 2016-01-10 DIAGNOSIS — J45909 Unspecified asthma, uncomplicated: Secondary | ICD-10-CM | POA: Diagnosis not present

## 2016-01-10 DIAGNOSIS — H52223 Regular astigmatism, bilateral: Secondary | ICD-10-CM | POA: Diagnosis not present

## 2016-01-10 DIAGNOSIS — I10 Essential (primary) hypertension: Secondary | ICD-10-CM | POA: Diagnosis not present

## 2016-01-10 DIAGNOSIS — E7800 Pure hypercholesterolemia, unspecified: Secondary | ICD-10-CM | POA: Diagnosis not present

## 2016-01-10 DIAGNOSIS — E669 Obesity, unspecified: Secondary | ICD-10-CM | POA: Diagnosis not present

## 2016-01-10 DIAGNOSIS — K219 Gastro-esophageal reflux disease without esophagitis: Secondary | ICD-10-CM | POA: Diagnosis not present

## 2016-01-10 DIAGNOSIS — H5213 Myopia, bilateral: Secondary | ICD-10-CM | POA: Diagnosis not present

## 2016-01-10 DIAGNOSIS — H524 Presbyopia: Secondary | ICD-10-CM | POA: Diagnosis not present

## 2016-01-17 MED FILL — VESIcare 5 MG TABS: 5 | 90 days supply | Qty: 90 | Fill #3

## 2016-01-30 MED FILL — VENTOLIN HFA 90 MCG INHALER: 108 (90 BAS | 16 days supply | Qty: 18 | Fill #1

## 2016-02-27 MED FILL — FLUoxetine HCL 40 MG CAPS: 40 | 90 days supply | Qty: 90 | Fill #1

## 2016-03-14 MED FILL — SIMVASTATIN 20 MG TABLET: 20 | 90 days supply | Qty: 90 | Fill #3

## 2016-03-14 MED FILL — LOSARTAN-HCTZ 100-25 MG TAB: 100-25 | 90 days supply | Qty: 90 | Fill #3

## 2016-03-14 MED FILL — ESOMEPRAZOLE MAG DR 40 MG C: 40 | 90 days supply | Qty: 90 | Fill #3

## 2016-04-04 DIAGNOSIS — F4323 Adjustment disorder with mixed anxiety and depressed mood: Secondary | ICD-10-CM | POA: Diagnosis not present

## 2016-04-16 MED FILL — PREMARIN 0.625 MG TABLET: 0.625 | 90 days supply | Qty: 90 | Fill #0

## 2016-04-16 MED FILL — VESIcare 5 MG TABS: 5 | 90 days supply | Qty: 90 | Fill #0

## 2016-05-21 MED FILL — FLUoxetine HCL 40 MG CAPS: 40 | 90 days supply | Qty: 90 | Fill #0

## 2016-06-18 DIAGNOSIS — T63441A Toxic effect of venom of bees, accidental (unintentional), initial encounter: Secondary | ICD-10-CM | POA: Diagnosis not present

## 2016-06-21 MED FILL — ESOMEPRAZOLE MAG DR 40 MG C: 40 | 90 days supply | Qty: 90 | Fill #0

## 2016-06-21 MED FILL — SIMVASTATIN 20 MG TABLET: 20 | 90 days supply | Qty: 90 | Fill #0

## 2016-06-21 MED FILL — LOSARTAN-HCTZ 100-25 MG TAB: 100-25 | 90 days supply | Qty: 90 | Fill #0

## 2016-07-19 DIAGNOSIS — Z6841 Body Mass Index (BMI) 40.0 and over, adult: Secondary | ICD-10-CM | POA: Diagnosis not present

## 2016-07-19 DIAGNOSIS — Z01419 Encounter for gynecological examination (general) (routine) without abnormal findings: Secondary | ICD-10-CM | POA: Diagnosis not present

## 2016-07-19 MED FILL — PREMARIN 0.625 MG TABLET: 0.625 | 90 days supply | Qty: 90 | Fill #0

## 2016-07-19 MED FILL — VESIcare 5 MG TABS: 5 | 90 days supply | Qty: 90 | Fill #0

## 2016-08-20 DIAGNOSIS — I10 Essential (primary) hypertension: Secondary | ICD-10-CM | POA: Diagnosis not present

## 2016-08-20 DIAGNOSIS — K219 Gastro-esophageal reflux disease without esophagitis: Secondary | ICD-10-CM | POA: Diagnosis not present

## 2016-08-20 DIAGNOSIS — E7800 Pure hypercholesterolemia, unspecified: Secondary | ICD-10-CM | POA: Diagnosis not present

## 2016-08-20 DIAGNOSIS — J45909 Unspecified asthma, uncomplicated: Secondary | ICD-10-CM | POA: Diagnosis not present

## 2016-08-20 DIAGNOSIS — E669 Obesity, unspecified: Secondary | ICD-10-CM | POA: Diagnosis not present

## 2016-08-21 MED FILL — FLUoxetine HCL 40 MG CAPS: 40 | 90 days supply | Qty: 90 | Fill #1

## 2016-08-27 DIAGNOSIS — E7800 Pure hypercholesterolemia, unspecified: Secondary | ICD-10-CM | POA: Diagnosis not present

## 2016-09-21 MED FILL — SIMVASTATIN 20 MG TABLET: 20 | 90 days supply | Qty: 90 | Fill #1

## 2016-09-21 MED FILL — ESOMEPRAZOLE MAG DR 40 MG C: 40 | 90 days supply | Qty: 90 | Fill #1

## 2016-09-21 MED FILL — LOSARTAN-HCTZ 100-25 MG TAB: 100-25 | 90 days supply | Qty: 90 | Fill #1

## 2016-10-25 MED FILL — PREMARIN 0.625 MG TABLET: 0.625 | 90 days supply | Qty: 90 | Fill #1

## 2016-10-25 MED FILL — VESIcare 5 MG TABS: 5 | 90 days supply | Qty: 90 | Fill #1

## 2016-11-21 DIAGNOSIS — F4323 Adjustment disorder with mixed anxiety and depressed mood: Secondary | ICD-10-CM | POA: Diagnosis not present

## 2016-11-29 MED FILL — FLUoxetine HCL 40 MG CAPS: 40 | 90 days supply | Qty: 90 | Fill #0

## 2016-12-04 DIAGNOSIS — L820 Inflamed seborrheic keratosis: Secondary | ICD-10-CM | POA: Diagnosis not present

## 2016-12-04 DIAGNOSIS — D485 Neoplasm of uncertain behavior of skin: Secondary | ICD-10-CM | POA: Diagnosis not present

## 2016-12-24 MED FILL — SIMVASTATIN 20 MG TABLET: 20 | 90 days supply | Qty: 90 | Fill #2

## 2016-12-24 MED FILL — ESOMEPRAZOLE MAG DR 40 MG C: 40 | 90 days supply | Qty: 90 | Fill #2

## 2016-12-24 MED FILL — LOSARTAN-HCTZ 100-25 MG TAB: 100-25 | 90 days supply | Qty: 90 | Fill #2

## 2017-01-14 DIAGNOSIS — H5213 Myopia, bilateral: Secondary | ICD-10-CM | POA: Diagnosis not present

## 2017-01-23 MED FILL — PREMARIN 0.625 MG TABLET: 0.625 | 90 days supply | Qty: 90 | Fill #2

## 2017-03-05 MED FILL — FLUoxetine HCL 40 MG CAPS: 40 | 90 days supply | Qty: 90 | Fill #1

## 2017-03-08 MED FILL — OXYBUTYNIN CL ER 10 MG TAB: 10 | 90 days supply | Qty: 90 | Fill #0

## 2017-03-25 MED FILL — SIMVASTATIN 20 MG TABLET: 20 | 90 days supply | Qty: 90 | Fill #0

## 2017-03-25 MED FILL — ESOMEPRAZOLE MAG DR 40 MG C: 40 | 90 days supply | Qty: 90 | Fill #0

## 2017-03-25 MED FILL — LOSARTAN-HCTZ 100-25 MG TAB: 100-25 | 90 days supply | Qty: 90 | Fill #0

## 2017-04-19 DIAGNOSIS — E7800 Pure hypercholesterolemia, unspecified: Secondary | ICD-10-CM | POA: Diagnosis not present

## 2017-04-19 DIAGNOSIS — Z780 Asymptomatic menopausal state: Secondary | ICD-10-CM | POA: Diagnosis not present

## 2017-04-19 DIAGNOSIS — N3281 Overactive bladder: Secondary | ICD-10-CM | POA: Diagnosis not present

## 2017-04-19 DIAGNOSIS — K219 Gastro-esophageal reflux disease without esophagitis: Secondary | ICD-10-CM | POA: Diagnosis not present

## 2017-04-19 DIAGNOSIS — I10 Essential (primary) hypertension: Secondary | ICD-10-CM | POA: Diagnosis not present

## 2017-04-19 DIAGNOSIS — J45909 Unspecified asthma, uncomplicated: Secondary | ICD-10-CM | POA: Diagnosis not present

## 2017-04-19 DIAGNOSIS — E669 Obesity, unspecified: Secondary | ICD-10-CM | POA: Diagnosis not present

## 2017-05-01 MED FILL — PREMARIN 0.625 MG TABLET: 0.625 | 90 days supply | Qty: 90 | Fill #3

## 2017-06-04 DIAGNOSIS — F4323 Adjustment disorder with mixed anxiety and depressed mood: Secondary | ICD-10-CM | POA: Diagnosis not present

## 2017-06-04 MED FILL — OXYBUTYNIN CL ER 10 MG TAB: 10 | 90 days supply | Qty: 90 | Fill #1

## 2017-06-04 MED FILL — FLUoxetine HCL 40 MG CAPS: 40 | 90 days supply | Qty: 90 | Fill #2

## 2017-07-02 MED FILL — LOSARTAN-HCTZ 100-25 MG TAB: 100-25 | 90 days supply | Qty: 90 | Fill #0

## 2017-07-02 MED FILL — SIMVASTATIN 20 MG TABLET: 20 | 90 days supply | Qty: 90 | Fill #0

## 2017-07-02 MED FILL — ESOMEPRAZOLE MAG DR 40 MG C: 40 | 90 days supply | Qty: 90 | Fill #0

## 2017-07-31 MED FILL — PREMARIN 0.625 MG TABLET: 0.625 | 90 days supply | Qty: 90 | Fill #0

## 2017-09-06 MED FILL — OXYBUTYNIN CL ER 10 MG TAB: 10 | 90 days supply | Qty: 90 | Fill #2

## 2017-09-06 MED FILL — FLUoxetine HCL 40 MG CAPS: 40 | 90 days supply | Qty: 90 | Fill #3

## 2017-10-07 MED FILL — LOSARTAN-HCTZ 100-25 MG TAB: 100-25 | 90 days supply | Qty: 90 | Fill #1

## 2017-10-07 MED FILL — SIMVASTATIN 20 MG TABLET: 20 | 90 days supply | Qty: 90 | Fill #1

## 2017-10-07 MED FILL — ESOMEPRAZOLE MAG DR 40 MG C: 40 | 90 days supply | Qty: 90 | Fill #1

## 2017-11-07 MED FILL — PREMARIN 0.625 MG TABLET: 0.625 | 90 days supply | Qty: 90 | Fill #1

## 2017-11-12 DIAGNOSIS — E669 Obesity, unspecified: Secondary | ICD-10-CM | POA: Diagnosis not present

## 2017-11-12 DIAGNOSIS — E7800 Pure hypercholesterolemia, unspecified: Secondary | ICD-10-CM | POA: Diagnosis not present

## 2017-11-12 DIAGNOSIS — N3281 Overactive bladder: Secondary | ICD-10-CM | POA: Diagnosis not present

## 2017-11-12 DIAGNOSIS — I10 Essential (primary) hypertension: Secondary | ICD-10-CM | POA: Diagnosis not present

## 2017-11-12 DIAGNOSIS — M545 Low back pain: Secondary | ICD-10-CM | POA: Diagnosis not present

## 2017-11-12 DIAGNOSIS — K219 Gastro-esophageal reflux disease without esophagitis: Secondary | ICD-10-CM | POA: Diagnosis not present

## 2017-11-12 DIAGNOSIS — Z780 Asymptomatic menopausal state: Secondary | ICD-10-CM | POA: Diagnosis not present

## 2017-11-27 DIAGNOSIS — F4323 Adjustment disorder with mixed anxiety and depressed mood: Secondary | ICD-10-CM | POA: Diagnosis not present

## 2017-12-18 MED FILL — FLUoxetine HCL 40 MG CAPS: 40 | 90 days supply | Qty: 90 | Fill #0

## 2017-12-18 MED FILL — OXYBUTYNIN CL ER 10 MG TAB: 10 | 90 days supply | Qty: 90 | Fill #3

## 2018-01-03 MED FILL — ESOMEPRAZOLE MAG DR 40 MG C: 40 | 90 days supply | Qty: 90 | Fill #2

## 2018-01-03 MED FILL — SIMVASTATIN 20 MG TABLET: 20 | 90 days supply | Qty: 90 | Fill #2

## 2018-01-03 MED FILL — LOSARTAN-HCTZ 100-25 MG TAB: 100-25 | 90 days supply | Qty: 90 | Fill #2

## 2018-01-21 DIAGNOSIS — M25561 Pain in right knee: Secondary | ICD-10-CM | POA: Diagnosis not present

## 2018-02-06 MED FILL — PREMARIN 0.625 MG TABLET: 0.625 | 90 days supply | Qty: 90 | Fill #2

## 2018-02-19 DIAGNOSIS — H5213 Myopia, bilateral: Secondary | ICD-10-CM | POA: Diagnosis not present

## 2018-03-12 DIAGNOSIS — L259 Unspecified contact dermatitis, unspecified cause: Secondary | ICD-10-CM | POA: Diagnosis not present

## 2018-03-12 MED FILL — predniSONE 20 MG TABS: 20 | 6 days supply | Qty: 12 | Fill #0

## 2018-03-20 MED FILL — OXYBUTYNIN CL ER 10 MG TAB: 10 | 90 days supply | Qty: 90 | Fill #0

## 2018-03-20 MED FILL — FLUoxetine HCL 40 MG CAPS: 40 | 90 days supply | Qty: 90 | Fill #1

## 2018-04-17 DIAGNOSIS — Z0000 Encounter for general adult medical examination without abnormal findings: Secondary | ICD-10-CM | POA: Diagnosis not present

## 2018-04-17 DIAGNOSIS — N3281 Overactive bladder: Secondary | ICD-10-CM | POA: Diagnosis not present

## 2018-04-17 DIAGNOSIS — E7800 Pure hypercholesterolemia, unspecified: Secondary | ICD-10-CM | POA: Diagnosis not present

## 2018-04-17 DIAGNOSIS — Z6841 Body Mass Index (BMI) 40.0 and over, adult: Secondary | ICD-10-CM | POA: Diagnosis not present

## 2018-04-17 DIAGNOSIS — E669 Obesity, unspecified: Secondary | ICD-10-CM | POA: Diagnosis not present

## 2018-04-17 DIAGNOSIS — I10 Essential (primary) hypertension: Secondary | ICD-10-CM | POA: Diagnosis not present

## 2018-04-17 DIAGNOSIS — K219 Gastro-esophageal reflux disease without esophagitis: Secondary | ICD-10-CM | POA: Diagnosis not present

## 2018-04-18 MED FILL — LOSARTAN-HCTZ 100-25 MG TAB: 100-25 | 90 days supply | Qty: 90 | Fill #3

## 2018-04-18 MED FILL — SIMVASTATIN 20 MG TABLET: 20 | 90 days supply | Qty: 90 | Fill #3

## 2018-04-18 MED FILL — ESOMEPRAZOLE MAG DR 40 MG C: 40 | 90 days supply | Qty: 90 | Fill #3

## 2018-04-29 DIAGNOSIS — F4323 Adjustment disorder with mixed anxiety and depressed mood: Secondary | ICD-10-CM | POA: Diagnosis not present

## 2018-05-07 MED FILL — PREMARIN 0.625 MG TABLET: 0.625 | 90 days supply | Qty: 90 | Fill #0

## 2018-06-25 MED FILL — FLUoxetine HCL 40 MG CAPS: 40 | 90 days supply | Qty: 90 | Fill #2

## 2018-06-25 MED FILL — OXYBUTYNIN CL ER 10 MG TAB: 10 | 90 days supply | Qty: 90 | Fill #0

## 2018-07-24 MED FILL — LOSARTAN-HCTZ 100-25 MG TAB: 100-25 | 90 days supply | Qty: 90 | Fill #0

## 2018-07-24 MED FILL — SIMVASTATIN 20 MG TABLET: 20 | 90 days supply | Qty: 90 | Fill #0

## 2018-07-24 MED FILL — ESOMEPRAZOLE MAG DR 40 MG C: 40 | 90 days supply | Qty: 90 | Fill #0

## 2018-08-06 ENCOUNTER — Other Ambulatory Visit: Payer: Self-pay | Admitting: Cardiovascular Disease

## 2018-08-06 MED ORDER — ONDANSETRON HCL 8 MG PO TABS: 8.0000 mg | ORAL_TABLET | Freq: Three times a day (TID) | ORAL | 0 refills | Status: DC | PRN

## 2018-08-06 NOTE — Progress Notes (Signed)
[name] is having lots of nausea Will send in script for Zofran 8 mg PO Q 8 Hr PRN    Mertie Moores, MD  08/06/2018 10:35 AM    [place],  [place], [place]  [address] Pager [phone] Phone: [phone]; Fax: [phone]

## 2018-08-19 MED FILL — PREMARIN 0.625 MG TABLET: 0.625 | 90 days supply | Qty: 90 | Fill #1

## 2018-09-24 DIAGNOSIS — F4324 Adjustment disorder with disturbance of conduct: Secondary | ICD-10-CM | POA: Diagnosis not present

## 2018-09-30 MED FILL — FLUoxetine HCL 40 MG CAPS: 40 | 90 days supply | Qty: 90 | Fill #3

## 2018-09-30 MED FILL — OXYBUTYNIN CL ER 10 MG TAB: 10 | 90 days supply | Qty: 90 | Fill #1

## 2018-10-22 HISTORY — PX: KNEE ARTHROSCOPY W/ MENISCAL REPAIR: SHX1877

## 2018-10-30 MED FILL — LOSARTAN-HCTZ 100-25 MG TAB: 100-25 | 90 days supply | Qty: 90 | Fill #1

## 2018-10-30 MED FILL — ESOMEPRAZOLE MAG DR 40 MG C: 40 | 90 days supply | Qty: 90 | Fill #1

## 2018-10-30 MED FILL — SIMVASTATIN 20 MG TABLET: 20 | 90 days supply | Qty: 90 | Fill #1

## 2018-11-05 DIAGNOSIS — Z780 Asymptomatic menopausal state: Secondary | ICD-10-CM | POA: Diagnosis not present

## 2018-11-05 DIAGNOSIS — E7800 Pure hypercholesterolemia, unspecified: Secondary | ICD-10-CM | POA: Diagnosis not present

## 2018-11-05 DIAGNOSIS — F320 Major depressive disorder, single episode, mild: Secondary | ICD-10-CM | POA: Diagnosis not present

## 2018-11-05 DIAGNOSIS — K219 Gastro-esophageal reflux disease without esophagitis: Secondary | ICD-10-CM | POA: Diagnosis not present

## 2018-11-05 DIAGNOSIS — N3281 Overactive bladder: Secondary | ICD-10-CM | POA: Diagnosis not present

## 2018-11-05 DIAGNOSIS — I10 Essential (primary) hypertension: Secondary | ICD-10-CM | POA: Diagnosis not present

## 2018-11-05 DIAGNOSIS — E669 Obesity, unspecified: Secondary | ICD-10-CM | POA: Diagnosis not present

## 2018-11-14 ENCOUNTER — Other Ambulatory Visit: Payer: Self-pay

## 2018-11-14 DIAGNOSIS — Z1231 Encounter for screening mammogram for malignant neoplasm of breast: Secondary | ICD-10-CM

## 2018-11-18 ENCOUNTER — Ambulatory Visit
Admission: RE | Admit: 2018-11-18 | Discharge: 2018-11-18 | Disposition: A | Discharge status: Home / Self Care | Payer: 59 | Source: Ambulatory Visit | Attending: Family Medicine | Admitting: Family Medicine

## 2018-11-18 DIAGNOSIS — Z1231 Encounter for screening mammogram for malignant neoplasm of breast: Secondary | ICD-10-CM | POA: Diagnosis not present

## 2018-11-28 MED FILL — PREMARIN 0.625 MG TABLET: 0.625 | 90 days supply | Qty: 90 | Fill #0 | Status: TO

## 2018-12-08 DIAGNOSIS — J309 Allergic rhinitis, unspecified: Secondary | ICD-10-CM | POA: Diagnosis not present

## 2018-12-17 MED FILL — MONTELUKAST SOD 10 MG TAB: 10 | 30 days supply | Qty: 30 | Fill #0

## 2019-01-05 MED FILL — OXYBUTYNIN CL ER 10 MG TAB: 10 | 90 days supply | Qty: 90 | Fill #2

## 2019-01-07 MED FILL — FLUoxetine HCL 40 MG CAPS: 40 | 90 days supply | Qty: 90 | Fill #0

## 2019-01-15 MED FILL — LOSARTAN-HCTZ 100-25 MG TAB: 100-25 | 90 days supply | Qty: 90 | Fill #0

## 2019-01-28 MED FILL — SIMVASTATIN 20 MG TABLET: 20 | 90 days supply | Qty: 90 | Fill #0

## 2019-01-28 MED FILL — ESOMEPRAZOLE MAG DR 40 MG C: 40 | 90 days supply | Qty: 90 | Fill #0

## 2019-02-16 MED FILL — PREMARIN 0.625 MG TABLET: 0.625 | 90 days supply | Qty: 90 | Fill #0

## 2019-02-23 DIAGNOSIS — H52222 Regular astigmatism, left eye: Secondary | ICD-10-CM | POA: Diagnosis not present

## 2019-02-23 DIAGNOSIS — H524 Presbyopia: Secondary | ICD-10-CM | POA: Diagnosis not present

## 2019-02-23 DIAGNOSIS — H5213 Myopia, bilateral: Secondary | ICD-10-CM | POA: Diagnosis not present

## 2019-03-26 DIAGNOSIS — F4324 Adjustment disorder with disturbance of conduct: Secondary | ICD-10-CM | POA: Diagnosis not present

## 2019-04-06 MED FILL — FLUoxetine HCL 40 MG CAPS: 40 | 90 days supply | Qty: 90 | Fill #1

## 2019-04-06 MED FILL — OXYBUTYNIN CL ER 10 MG TAB: 10 | 90 days supply | Qty: 90 | Fill #3

## 2019-04-22 DIAGNOSIS — F320 Major depressive disorder, single episode, mild: Secondary | ICD-10-CM | POA: Diagnosis not present

## 2019-04-22 DIAGNOSIS — Z780 Asymptomatic menopausal state: Secondary | ICD-10-CM | POA: Diagnosis not present

## 2019-04-22 DIAGNOSIS — E669 Obesity, unspecified: Secondary | ICD-10-CM | POA: Diagnosis not present

## 2019-04-22 DIAGNOSIS — Z0000 Encounter for general adult medical examination without abnormal findings: Secondary | ICD-10-CM | POA: Diagnosis not present

## 2019-04-22 DIAGNOSIS — N3281 Overactive bladder: Secondary | ICD-10-CM | POA: Diagnosis not present

## 2019-04-22 DIAGNOSIS — K219 Gastro-esophageal reflux disease without esophagitis: Secondary | ICD-10-CM | POA: Diagnosis not present

## 2019-04-22 DIAGNOSIS — J309 Allergic rhinitis, unspecified: Secondary | ICD-10-CM | POA: Diagnosis not present

## 2019-04-22 DIAGNOSIS — E7800 Pure hypercholesterolemia, unspecified: Secondary | ICD-10-CM | POA: Diagnosis not present

## 2019-04-22 DIAGNOSIS — I10 Essential (primary) hypertension: Secondary | ICD-10-CM | POA: Diagnosis not present

## 2019-05-05 MED FILL — ESOMEPRAZOLE MAG DR 40 MG C: 40 | 90 days supply | Qty: 90 | Fill #0

## 2019-05-05 MED FILL — SIMVASTATIN 20 MG TABLET: 20 | 90 days supply | Qty: 90 | Fill #0

## 2019-05-05 MED FILL — LOSARTAN-HCTZ 100-25 MG TAB: 100-25 | 90 days supply | Qty: 90 | Fill #0

## 2019-06-05 DIAGNOSIS — I10 Essential (primary) hypertension: Secondary | ICD-10-CM | POA: Diagnosis not present

## 2019-06-05 MED FILL — PREMARIN 0.625 MG TABLET: 0.625 | 90 days supply | Qty: 90 | Fill #0

## 2019-07-09 MED FILL — FLUoxetine HCL 40 MG CAPS: 40 | 90 days supply | Qty: 90 | Fill #0

## 2019-07-09 MED FILL — OXYBUTYNIN CL ER 10 MG TAB: 10 | 90 days supply | Qty: 90 | Fill #0

## 2019-08-01 DIAGNOSIS — M25562 Pain in left knee: Secondary | ICD-10-CM | POA: Diagnosis not present

## 2019-08-10 MED FILL — ESOMEPRAZOLE MAG DR 40 MG C: 40 | 90 days supply | Qty: 90 | Fill #0

## 2019-08-10 MED FILL — LOSARTAN-HCTZ 100-25 MG TAB: 100-25 | 90 days supply | Qty: 90 | Fill #0

## 2019-08-10 MED FILL — SIMVASTATIN 20 MG TABLET: 20 | 90 days supply | Qty: 90 | Fill #0

## 2019-08-28 DIAGNOSIS — M25562 Pain in left knee: Secondary | ICD-10-CM | POA: Diagnosis not present

## 2019-09-07 DIAGNOSIS — M25562 Pain in left knee: Secondary | ICD-10-CM | POA: Diagnosis not present

## 2019-09-10 MED FILL — PREMARIN 0.625 MG TABLET: 0.625 | 90 days supply | Qty: 90 | Fill #1

## 2019-09-14 DIAGNOSIS — M25562 Pain in left knee: Secondary | ICD-10-CM | POA: Diagnosis not present

## 2019-09-23 DIAGNOSIS — M94262 Chondromalacia, left knee: Secondary | ICD-10-CM | POA: Diagnosis not present

## 2019-09-23 DIAGNOSIS — S83242A Other tear of medial meniscus, current injury, left knee, initial encounter: Secondary | ICD-10-CM | POA: Diagnosis not present

## 2019-09-23 DIAGNOSIS — M23612 Other spontaneous disruption of anterior cruciate ligament of left knee: Secondary | ICD-10-CM | POA: Diagnosis not present

## 2019-09-23 MED FILL — HYDROCODON-APAP 5-325: 5-325 | 7 days supply | Qty: 42 | Fill #0

## 2019-09-23 MED FILL — XARELTO 10 MG TABLET: 10 | 20 days supply | Qty: 20 | Fill #0

## 2019-10-08 ENCOUNTER — Ambulatory Visit: Payer: 59 | Attending: Orthopedic Surgery | Admitting: Physical Therapy

## 2019-10-08 ENCOUNTER — Other Ambulatory Visit: Payer: Self-pay

## 2019-10-08 ENCOUNTER — Encounter: Payer: Self-pay | Admitting: Physical Therapy

## 2019-10-08 DIAGNOSIS — M25562 Pain in left knee: Secondary | ICD-10-CM | POA: Insufficient documentation

## 2019-10-08 DIAGNOSIS — R600 Localized edema: Secondary | ICD-10-CM | POA: Insufficient documentation

## 2019-10-08 DIAGNOSIS — M25662 Stiffness of left knee, not elsewhere classified: Secondary | ICD-10-CM | POA: Diagnosis not present

## 2019-10-08 NOTE — Therapy (Signed)
[facility] [place], [place], [address] Phone: [phone]   Fax:  [phone]  Physical Therapy Evaluation  Patient Details  Name: [name] MRN: [id number] Date of Birth: 1971-07-12 Referring Provider (PT): Latanya Maudlin MD   Encounter Date: 10/08/2019  PT End of Session - 10/08/19 1722    Visit Number  1    Number of Visits  6    Date for PT Re-Evaluation  10/29/19    Authorization Type  FOTO AT LEAST EVERY 5TH VISIT.    PT Start Time  0232    PT Stop Time  0319    PT Time Calculation (min)  47 min    Activity Tolerance  Patient tolerated treatment well    Behavior During Therapy  [facility] for tasks assessed/performed       Past Medical History:  Diagnosis Date  . Cervical cancer ([place])   . Depression   . GERD (gastroesophageal reflux disease)   . Hyperlipidemia   . Hypertension   . Peptic ulcer disease   . Urinary incontinence     Past Surgical History:  Procedure Laterality Date  . CARDIAC CATHETERIZATION    . CESAREAN SECTION    . COMBINED HYSTERECTOMY ABDOMINAL W/ MMK / BURCH PROCEDURE    . ENDOMETRIAL ABLATION  12/01/2004   Archie Endo 03/07/2011 (04/07/2013)  . LAPAROSCOPIC CHOLECYSTECTOMY  01/26/2004   Archie Endo 03/07/2011 (04/07/2013)  . TOTAL ABDOMINAL HYSTERECTOMY    . TUBAL LIGATION  08/21/2001   Archie Endo 03/07/2011 (04/07/2013)    There were no vitals filed for this visit.   Subjective Assessment - 10/08/19 1745    Subjective  COVID-19 screen performed prior to patient entering clinic.  The patient underwent a left knee arthroscopic surgery on 09/23/19.  She states she was on a walker for about a week then without assistive device and weight bearing as tolerated since then.  She has been compliant with a HEP.  She is not reporting any pain at rest today but her pain will rise to higher levels with stairs and prolonged sitting.    Pertinent History  HTN.    Patient Stated Goals  Perform ADL's without pain.     Currently in Pain?  No/denies         [facility] PT Assessment - 10/08/19 0001      Assessment   Medical Diagnosis  Left knee arthroscopic surgery.    Referring Provider (PT)  Latanya Maudlin MD    Onset Date/Surgical Date  --   09/23/19 (surgery date).     Precautions   Precautions  --   PAIN-FREE LEFT LE STRENGTHENING EXERCISES.     Restrictions   Weight Bearing Restrictions  No      Balance Screen   Has the patient fallen in the past 6 months  No    Has the patient had a decrease in activity level because of a fear of falling?   No    Is the patient reluctant to leave their home because of a fear of falling?   No      Home Environment   Living Environment  Private residence      Prior Function   Level of Independence  Independent      Observation/Other Assessments   Observations  Left knee scope sites appear to be healing well.    Focus on Therapeutic Outcomes (FOTO)   58% limitation.      Observation/Other Assessments-Edema    Edema  Circumferential  Circumferential Edema   Circumferential - Right  LT 4 cms > RT.      ROM / Strength   AROM / PROM / Strength  AROM;Strength      AROM   Overall AROM Comments  -5 to 110 degrees on left.      Strength   Overall Strength Comments  Left hip and left knee extension is essentially normal.      Palpation   Palpation comment  No significant areas of palpable left knee pain today.      Ambulation/Gait   Gait Comments  Gait cycle is essentially normal.                Objective measurements completed on examination: See above findings.      [facility] Adult PT Treatment/Exercise - 10/08/19 0001      Modalities   Modalities  Electrical Stimulation;Vasopneumatic      Electrical Stimulation   Electrical Stimulation Location  Left knee.    Electrical Stimulation Action  IFC    Electrical Stimulation Parameters  1-10 Hz x 20 minutes.    Electrical Stimulation Goals  Edema      Vasopneumatic   Number  Minutes Vasopneumatic   20 minutes    Vasopnuematic Location   --   Left knee.   Vasopneumatic Pressure  Low             PT Education - 10/08/19 1757    Education Details  Prone hang.          PT Long Term Goals - 10/08/19 1800      PT LONG TERM GOAL #1   Title  Independent with a HEP.    Time  3    Period  Weeks    Status  New      PT LONG TERM GOAL #2   Title  Perform a reciprocating stair gait with pain not > 2-3/10.    Time  3    Period  Weeks    Status  New             Plan - 10/08/19 1756    Clinical Impression Statement  The patient is s/p left knee arthroscopic surgery performed on 09/23/19.  She is pleased with her progress thus far.  She continues to experience pain with prolonged sitting and performing stairs.  She lacks some flexion and extension.  She has a moderate amount of edema currently.    Examination-Activity Limitations  Stairs    Examination-Participation Restrictions  Other    Stability/Clinical Decision Making  Stable/Uncomplicated    Clinical Decision Making  Low    Rehab Potential  Excellent    PT Frequency  2x / week    PT Duration  3 weeks    PT Treatment/Interventions  ADLs/Self Care Home Management;Cryotherapy;Electrical Stimulation;Ultrasound;Moist Heat;Stair training;Therapeutic activities;Therapeutic exercise;Manual techniques;Patient/family education;Passive range of motion;Vasopneumatic Device    PT Next Visit Plan  Nustep/stationary bike, pain-free left quadriceps strengthening.  OKC/CKC.  Vasopneumatic and electrical stimulation.    Consulted and Agree with Plan of Care  Patient       Patient will benefit from skilled therapeutic intervention in order to improve the following deficits and impairments:  Other (comment), Pain, Increased edema, Decreased activity tolerance, Decreased range of motion  Visit Diagnosis: Acute pain of left knee - Plan: PT plan of care cert/re-cert  Stiffness of left knee, not elsewhere  classified - Plan: PT plan of care cert/re-cert  Localized edema - Plan: PT plan  of care cert/re-cert     Problem List Patient Active Problem List   Diagnosis Date Noted  . Suicide attempt ([place]) 04/07/2013  . Benzodiazepine overdose 04/07/2013  . Hypertension 04/07/2013  . Hyperlipidemia 04/07/2013  . Hypokalemia 04/07/2013  . Depression 04/07/2013  . Pulmonary nodule, right 12/27/2011    Chimaobi Casebolt, [place] MPT 10/08/2019, 6:03 PM  [facility] [place], [place], [address] Phone: [phone]   Fax:  [phone]  Name: [name] MRN: [id number] Date of Birth: 1971-09-26

## 2019-10-12 ENCOUNTER — Encounter: Payer: Self-pay | Admitting: Physical Therapy

## 2019-10-12 ENCOUNTER — Ambulatory Visit: Payer: 59 | Admitting: Physical Therapy

## 2019-10-12 ENCOUNTER — Other Ambulatory Visit: Payer: Self-pay

## 2019-10-12 DIAGNOSIS — R600 Localized edema: Secondary | ICD-10-CM | POA: Diagnosis not present

## 2019-10-12 DIAGNOSIS — M25662 Stiffness of left knee, not elsewhere classified: Secondary | ICD-10-CM | POA: Diagnosis not present

## 2019-10-12 DIAGNOSIS — M25562 Pain in left knee: Secondary | ICD-10-CM

## 2019-10-12 MED FILL — OXYBUTYNIN CL ER 10 MG TAB: 10 | 90 days supply | Qty: 90 | Fill #1

## 2019-10-12 MED FILL — FLUoxetine HCL 40 MG CAPS: 40 | 90 days supply | Qty: 90 | Fill #1

## 2019-10-12 NOTE — Therapy (Signed)
[facility] [place], [place], [address] Phone: [phone]   Fax:  [phone]  Physical Therapy Treatment  Patient Details  Name: [name] MRN: [id number] Date of Birth: Apr 26, 1971 Referring Provider (PT): Latanya Maudlin MD   Encounter Date: 10/12/2019  PT End of Session - 10/12/19 0828    Visit Number  2    Number of Visits  6    Date for PT Re-Evaluation  10/29/19    Authorization Type  FOTO AT LEAST EVERY 5TH VISIT.    PT Start Time  0815    PT Stop Time  0910    PT Time Calculation (min)  55 min    Activity Tolerance  Patient tolerated treatment well    Behavior During Therapy  [facility] for tasks assessed/performed       Past Medical History:  Diagnosis Date  . Cervical cancer ([place])   . Depression   . GERD (gastroesophageal reflux disease)   . Hyperlipidemia   . Hypertension   . Peptic ulcer disease   . Urinary incontinence     Past Surgical History:  Procedure Laterality Date  . CARDIAC CATHETERIZATION    . CESAREAN SECTION    . COMBINED HYSTERECTOMY ABDOMINAL W/ MMK / BURCH PROCEDURE    . ENDOMETRIAL ABLATION  12/01/2004   Archie Endo 03/07/2011 (04/07/2013)  . LAPAROSCOPIC CHOLECYSTECTOMY  01/26/2004   Archie Endo 03/07/2011 (04/07/2013)  . TOTAL ABDOMINAL HYSTERECTOMY    . TUBAL LIGATION  08/21/2001   Archie Endo 03/07/2011 (04/07/2013)    There were no vitals filed for this visit.  Subjective Assessment - 10/12/19 0825    Subjective  COVID-19 screen performed prior to patient entering clinic.  The pt arriving today reporting no pain.    Pertinent History  HTN.    Patient Stated Goals  Perform ADL's without pain.    Currently in Pain?  No/denies                       [facility] Adult PT Treatment/Exercise - 10/12/19 0001      Exercises   Exercises  Knee/Hip      Knee/Hip Exercises: Stretches   Passive Hamstring Stretch  Left;30 seconds    Gastroc Stretch  Left;5 reps;20 seconds      Knee/Hip  Exercises: Aerobic   Stationary Bike  L2 x 10 minutes      Knee/Hip Exercises: Standing   Heel Raises  Both;10 reps    Forward Lunges  Left;15 reps;3 seconds;Limitations    Forward Lunges Limitations  L foot on 6 inch step    Forward Step Up  Right;Left;15 reps;Hand Hold: 2;Step Height: 6"    Functional Squat  2 sets;10 reps;Limitations    Functional Squat Limitations  instructions to keep weight over midfoot to prvent posterior lean    Rocker Board  4 minutes;Limitations   UE support on parallel bars   Rocker Board Limitations  2 minutes side to side, 2 minutes forward and back      Modalities   Modalities  Psychologist, educational Location  L knee    Electrical Stimulation Action  IFC    Electrical Stimulation Parameters  80-150 Hz x 15 minutes, intensity to tolerance    Electrical Stimulation Goals  Edema      Vasopneumatic   Number Minutes Vasopneumatic   15 minutes    Vasopneumatic Pressure  Low    Vasopneumatic Temperature  34      Manual Therapy   Manual Therapy  Passive ROM    Passive ROM  knee flexion/extension                  PT Long Term Goals - 10/12/19 0834      PT LONG TERM GOAL #1   Title  Independent with a HEP.    Time  3    Status  On-going      PT LONG TERM GOAL #2   Title  Perform a reciprocating stair gait with pain not > 2-3/10.    Time  3    Period  Weeks    Status  On-going            Plan - 10/12/19 [id number]    Clinical Impression Statement  Pt tolerating treatment well. Pt encouraged in pain free ROM. Pt reporting getting up after prolonged sitting is painful and stiffness. Pt also reporting walking longer period of time. Pt reporting no pain throughout session. Continue skilled PT to progress towrad LTG's set.    Examination-Activity Limitations  Stairs    Stability/Clinical Decision Making  Stable/Uncomplicated    Rehab Potential  Excellent    PT Frequency   2x / week    PT Duration  3 weeks    PT Treatment/Interventions  ADLs/Self Care Home Management;Cryotherapy;Electrical Stimulation;Ultrasound;Moist Heat;Stair training;Therapeutic activities;Therapeutic exercise;Manual techniques;Patient/family education;Passive range of motion;Vasopneumatic Device    PT Next Visit Plan  Nustep/stationary bike, pain-free left quadriceps strengthening.  OKC/CKC.  Vasopneumatic and electrical stimulation.    Consulted and Agree with Plan of Care  Patient       Patient will benefit from skilled therapeutic intervention in order to improve the following deficits and impairments:  Other (comment), Pain, Increased edema, Decreased activity tolerance, Decreased range of motion  Visit Diagnosis: Acute pain of left knee  Stiffness of left knee, not elsewhere classified  Localized edema     Problem List Patient Active Problem List   Diagnosis Date Noted  . Suicide attempt ([place]) 04/07/2013  . Benzodiazepine overdose 04/07/2013  . Hypertension 04/07/2013  . Hyperlipidemia 04/07/2013  . Hypokalemia 04/07/2013  . Depression 04/07/2013  . Pulmonary nodule, right 12/27/2011    Oretha Caprice, PT 10/12/2019, 9:33 AM  [facility] [place], [place], [address] Phone: [phone]   Fax:  [phone]  Name: [name] MRN: [id number] Date of Birth: 1971/07/28

## 2019-10-14 ENCOUNTER — Ambulatory Visit: Payer: 59 | Admitting: Physical Therapy

## 2019-10-14 ENCOUNTER — Encounter: Payer: Self-pay | Admitting: Physical Therapy

## 2019-10-14 ENCOUNTER — Other Ambulatory Visit: Payer: Self-pay

## 2019-10-14 DIAGNOSIS — M25562 Pain in left knee: Secondary | ICD-10-CM | POA: Diagnosis not present

## 2019-10-14 DIAGNOSIS — M25662 Stiffness of left knee, not elsewhere classified: Secondary | ICD-10-CM

## 2019-10-14 DIAGNOSIS — R600 Localized edema: Secondary | ICD-10-CM

## 2019-10-14 NOTE — Therapy (Signed)
[facility] [place], [place], [address] Phone: [phone]   Fax:  [phone]  Physical Therapy Treatment  Patient Details  Name: [name] MRN: [id number] Date of Birth: [birth year] Referring Provider (PT): Latanya Maudlin MD   Encounter Date: 10/14/2019  PT End of Session - 10/14/19 0903    Visit Number  3    Number of Visits  6    Date for PT Re-Evaluation  10/29/19    Authorization Type  FOTO AT LEAST EVERY 5TH VISIT.    PT Start Time  0901    PT Stop Time  0952    PT Time Calculation (min)  51 min    Activity Tolerance  Patient tolerated treatment well    Behavior During Therapy  [facility] for tasks assessed/performed       Past Medical History:  Diagnosis Date  . Cervical cancer ([place])   . Depression   . GERD (gastroesophageal reflux disease)   . Hyperlipidemia   . Hypertension   . Peptic ulcer disease   . Urinary incontinence     Past Surgical History:  Procedure Laterality Date  . CARDIAC CATHETERIZATION    . CESAREAN SECTION    . COMBINED HYSTERECTOMY ABDOMINAL W/ MMK / BURCH PROCEDURE    . ENDOMETRIAL ABLATION  12/01/2004   Archie Endo 03/07/2011 (04/07/2013)  . LAPAROSCOPIC CHOLECYSTECTOMY  01/26/2004   Archie Endo 03/07/2011 (04/07/2013)  . TOTAL ABDOMINAL HYSTERECTOMY    . TUBAL LIGATION  08/21/2001   Archie Endo 03/07/2011 (04/07/2013)    There were no vitals filed for this visit.  Subjective Assessment - 10/14/19 0902    Subjective  COVID-19 screening performed upon arrival.  Patient arrived feeling good after last session. No reports of pain currently.    Pertinent History  HTN.    Patient Stated Goals  Perform ADL's without pain.    Currently in Pain?  No/denies         [facility] PT Assessment - 10/14/19 0001      Assessment   Medical Diagnosis  Left knee arthroscopic surgery.    Referring Provider (PT)  Latanya Maudlin MD    Onset Date/Surgical Date  09/23/19    Next MD Visit  10/27/2019      Precautions   Precaution Comments  Pain free strengthening                   [facility] Adult PT Treatment/Exercise - 10/14/19 0001      Exercises   Exercises  Knee/Hip      Knee/Hip Exercises: Aerobic   Stationary Bike  L3 x 12 minutes      Knee/Hip Exercises: Standing   Heel Raises  Both;10 reps;2 sets    Heel Raises Limitations  parallel and ER 2x10    Forward Lunges  15 reps;3 seconds;Limitations;Both    Forward Lunges Limitations  on 6 inch step    Rocker Board  4 minutes;Limitations    Rocker Board Limitations  2 minutes side to side, 2 minutes forward and back      Knee/Hip Exercises: Supine   Other Supine Knee/Hip Exercises  clam shells x30      Modalities   Modalities  Electrical Stimulation;Vasopneumatic      Electrical Stimulation   Electrical Stimulation Location  L knee    Archivist Stimulation Parameters  80-150 hz x15 mins    Electrical Stimulation Goals  Edema      Vasopneumatic  Number Minutes Vasopneumatic   15 minutes    Vasopnuematic Location   Knee    Vasopneumatic Pressure  Low    Vasopneumatic Temperature   34                  PT Long Term Goals - 10/12/19 0834      PT LONG TERM GOAL #1   Title  Independent with a HEP.    Time  3    Status  On-going      PT LONG TERM GOAL #2   Title  Perform a reciprocating stair gait with pain not > 2-3/10.    Time  3    Period  Weeks    Status  On-going            Plan - 10/14/19 0940    Clinical Impression Statement  Patient responded well to therapy session with no reports of increased pain. Patient demonstrated good form after explanation and demonstration. Patient provided with new HEP consisting of knee flexion stretch on step, heel raises and heel raises with external rotation. No adverse affects upon removal upon modalities.    Examination-Activity Limitations  Stairs    Examination-Participation Restrictions  Other     Stability/Clinical Decision Making  Stable/Uncomplicated    Clinical Decision Making  Low    Rehab Potential  Excellent    PT Frequency  2x / week    PT Duration  3 weeks    PT Treatment/Interventions  ADLs/Self Care Home Management;Cryotherapy;Electrical Stimulation;Ultrasound;Moist Heat;Stair training;Therapeutic activities;Therapeutic exercise;Manual techniques;Patient/family education;Passive range of motion;Vasopneumatic Device    PT Next Visit Plan  Nustep/stationary bike, pain-free left quadriceps strengthening.  OKC/CKC.  Vasopneumatic and electrical stimulation.    PT Home Exercise Plan  see patient education section    Consulted and Agree with Plan of Care  Patient       Patient will benefit from skilled therapeutic intervention in order to improve the following deficits and impairments:  Other (comment), Pain, Increased edema, Decreased activity tolerance, Decreased range of motion  Visit Diagnosis: Acute pain of left knee  Stiffness of left knee, not elsewhere classified  Localized edema     Problem List Patient Active Problem List   Diagnosis Date Noted  . Suicide attempt ([place]) 04/07/2013  . Benzodiazepine overdose 04/07/2013  . Hypertension 04/07/2013  . Hyperlipidemia 04/07/2013  . Hypokalemia 04/07/2013  . Depression 04/07/2013  . Pulmonary nodule, right 12/27/2011    Gabriela Eves, PT, DPT 10/14/2019, 9:55 AM  [facility] [place], [place], [address] Phone: [phone]   Fax:  [phone]  Name: [name] MRN: [id number] Date of Birth: 1971-07-01

## 2019-10-19 ENCOUNTER — Ambulatory Visit: Payer: 59 | Admitting: Physical Therapy

## 2019-10-21 ENCOUNTER — Other Ambulatory Visit: Payer: Self-pay

## 2019-10-21 ENCOUNTER — Encounter: Payer: Self-pay | Admitting: Physical Therapy

## 2019-10-21 ENCOUNTER — Ambulatory Visit: Payer: 59 | Admitting: Physical Therapy

## 2019-10-21 DIAGNOSIS — M25562 Pain in left knee: Secondary | ICD-10-CM | POA: Diagnosis not present

## 2019-10-21 DIAGNOSIS — R600 Localized edema: Secondary | ICD-10-CM | POA: Diagnosis not present

## 2019-10-21 DIAGNOSIS — M25662 Stiffness of left knee, not elsewhere classified: Secondary | ICD-10-CM | POA: Diagnosis not present

## 2019-10-21 NOTE — Therapy (Signed)
[facility] [place], [place], [address] Phone: [phone]   Fax:  [phone]  Physical Therapy Treatment  Patient Details  Name: [name] MRN: [id number] Date of Birth: 10-21-1971 Referring Provider (PT): Latanya Maudlin MD   Encounter Date: 10/21/2019  PT End of Session - 10/21/19 0859    Visit Number  4    Number of Visits  6    Date for PT Re-Evaluation  10/29/19    Authorization Type  FOTO AT LEAST EVERY 5TH VISIT.    PT Start Time  0815    PT Stop Time  0908    PT Time Calculation (min)  53 min    Activity Tolerance  Patient tolerated treatment well    Behavior During Therapy  [facility] for tasks assessed/performed       Past Medical History:  Diagnosis Date  . Cervical cancer ([place])   . Depression   . GERD (gastroesophageal reflux disease)   . Hyperlipidemia   . Hypertension   . Peptic ulcer disease   . Urinary incontinence     Past Surgical History:  Procedure Laterality Date  . CARDIAC CATHETERIZATION    . CESAREAN SECTION    . COMBINED HYSTERECTOMY ABDOMINAL W/ MMK / BURCH PROCEDURE    . ENDOMETRIAL ABLATION  12/01/2004   Archie Endo 03/07/2011 (04/07/2013)  . LAPAROSCOPIC CHOLECYSTECTOMY  01/26/2004   Archie Endo 03/07/2011 (04/07/2013)  . TOTAL ABDOMINAL HYSTERECTOMY    . TUBAL LIGATION  08/21/2001   Archie Endo 03/07/2011 (04/07/2013)    There were no vitals filed for this visit.  Subjective Assessment - 10/21/19 0855    Subjective  COVID-19 screen performed prior to patient entering clinic.  Doing good.    Pertinent History  HTN.    Patient Stated Goals  Perform ADL's without pain.    Currently in Pain?  Yes    Pain Score  1     Pain Location  Knee    Pain Orientation  Left    Pain Descriptors / Indicators  Dull    Pain Type  Surgical pain                       [facility] Adult PT Treatment/Exercise - 10/21/19 0001      Exercises   Exercises  Knee/Hip      Knee/Hip Exercises: Aerobic   Stationary Bike  Level 3 x 15 minutes.      Knee/Hip Exercises: Machines for Strengthening   Cybex Knee Extension  10# x 4 minutes.    Cybex Leg Press  2 plates x 4 minutes.      Acupuncturist Location  Left knee.    Electrical Stimulation Action  IFC    Electrical Stimulation Parameters  80-150 x 15 minutes.    Electrical Stimulation Goals  Edema;Pain      Vasopneumatic   Number Minutes Vasopneumatic   15 minutes    Vasopnuematic Location   --   Left knee.   Vasopneumatic Pressure  Low                  PT Long Term Goals - 10/12/19 0834      PT LONG TERM GOAL #1   Title  Independent with a HEP.    Time  3    Status  On-going      PT LONG TERM GOAL #2   Title  Perform a reciprocating stair gait with pain not >  2-3/10.    Time  3    Period  Weeks    Status  On-going            Plan - 10/21/19 0900    Clinical Impression Statement  The patient did great today with resisted exercise on machines today without pain.    Examination-Activity Limitations  Stairs    Examination-Participation Restrictions  Other    Stability/Clinical Decision Making  Stable/Uncomplicated    Rehab Potential  Excellent    PT Frequency  2x / week    PT Duration  3 weeks    PT Treatment/Interventions  ADLs/Self Care Home Management;Cryotherapy;Electrical Stimulation;Ultrasound;Moist Heat;Stair training;Therapeutic activities;Therapeutic exercise;Manual techniques;Patient/family education;Passive range of motion;Vasopneumatic Device    PT Next Visit Plan  Nustep/stationary bike, pain-free left quadriceps strengthening.  OKC/CKC.  Vasopneumatic and electrical stimulation.    PT Home Exercise Plan  see patient education section    Consulted and Agree with Plan of Care  Patient       Patient will benefit from skilled therapeutic intervention in order to improve the following deficits and impairments:  Other (comment), Pain, Increased edema, Decreased  activity tolerance, Decreased range of motion  Visit Diagnosis: Acute pain of left knee  Stiffness of left knee, not elsewhere classified  Localized edema     Problem List Patient Active Problem List   Diagnosis Date Noted  . Suicide attempt ([place]) 04/07/2013  . Benzodiazepine overdose 04/07/2013  . Hypertension 04/07/2013  . Hyperlipidemia 04/07/2013  . Hypokalemia 04/07/2013  . Depression 04/07/2013  . Pulmonary nodule, right 12/27/2011    Tyrae Alcoser, [place] MPT 10/21/2019, 11:28 AM  [facility] [address] [place], [place], [address] Phone: [phone]   Fax:  [phone]  Name: [name] MRN: [id number] Date of Birth: 21-Jan-1971

## 2019-10-26 ENCOUNTER — Ambulatory Visit: Payer: 59 | Attending: Orthopedic Surgery | Admitting: Physical Therapy

## 2019-10-26 ENCOUNTER — Other Ambulatory Visit: Payer: Self-pay

## 2019-10-26 DIAGNOSIS — J309 Allergic rhinitis, unspecified: Secondary | ICD-10-CM | POA: Diagnosis not present

## 2019-10-26 DIAGNOSIS — M25662 Stiffness of left knee, not elsewhere classified: Secondary | ICD-10-CM | POA: Insufficient documentation

## 2019-10-26 DIAGNOSIS — M25562 Pain in left knee: Secondary | ICD-10-CM | POA: Insufficient documentation

## 2019-10-26 DIAGNOSIS — R600 Localized edema: Secondary | ICD-10-CM | POA: Insufficient documentation

## 2019-10-26 DIAGNOSIS — Z780 Asymptomatic menopausal state: Secondary | ICD-10-CM | POA: Diagnosis not present

## 2019-10-26 DIAGNOSIS — N3281 Overactive bladder: Secondary | ICD-10-CM | POA: Diagnosis not present

## 2019-10-26 DIAGNOSIS — F320 Major depressive disorder, single episode, mild: Secondary | ICD-10-CM | POA: Diagnosis not present

## 2019-10-26 DIAGNOSIS — E7800 Pure hypercholesterolemia, unspecified: Secondary | ICD-10-CM | POA: Diagnosis not present

## 2019-10-26 DIAGNOSIS — K219 Gastro-esophageal reflux disease without esophagitis: Secondary | ICD-10-CM | POA: Diagnosis not present

## 2019-10-26 DIAGNOSIS — E669 Obesity, unspecified: Secondary | ICD-10-CM | POA: Diagnosis not present

## 2019-10-26 DIAGNOSIS — I10 Essential (primary) hypertension: Secondary | ICD-10-CM | POA: Diagnosis not present

## 2019-10-26 NOTE — Therapy (Signed)
[facility] [place], [place], [address] Phone: [phone]   Fax:  [phone]  Physical Therapy Treatment  Patient Details  Name: [name] MRN: [id number] Date of Birth: August 07, 1971 Referring Provider (PT): Latanya Maudlin MD   Encounter Date: 10/26/2019  PT End of Session - 10/26/19 1024    Visit Number  5    Number of Visits  6    Date for PT Re-Evaluation  10/29/19    Authorization Type  FOTO AT LEAST EVERY 5TH VISIT.    PT Start Time  0945    PT Stop Time  1028    PT Time Calculation (min)  43 min    Activity Tolerance  Patient tolerated treatment well    Behavior During Therapy  [facility] for tasks assessed/performed       Past Medical History:  Diagnosis Date  . Cervical cancer ([place])   . Depression   . GERD (gastroesophageal reflux disease)   . Hyperlipidemia   . Hypertension   . Peptic ulcer disease   . Urinary incontinence     Past Surgical History:  Procedure Laterality Date  . CARDIAC CATHETERIZATION    . CESAREAN SECTION    . COMBINED HYSTERECTOMY ABDOMINAL W/ MMK / BURCH PROCEDURE    . ENDOMETRIAL ABLATION  12/01/2004   Archie Endo 03/07/2011 (04/07/2013)  . LAPAROSCOPIC CHOLECYSTECTOMY  01/26/2004   Archie Endo 03/07/2011 (04/07/2013)  . TOTAL ABDOMINAL HYSTERECTOMY    . TUBAL LIGATION  08/21/2001   Archie Endo 03/07/2011 (04/07/2013)    There were no vitals filed for this visit.  Subjective Assessment - 10/26/19 1018    Subjective  COVID-19 screen performed prior to patient entering clinic.  No new complaints.    Pertinent History  HTN.    Patient Stated Goals  Perform ADL's without pain.    Currently in Pain?  Yes    Pain Score  1     Pain Location  Knee    Pain Orientation  Left    Pain Descriptors / Indicators  Dull    Pain Type  Surgical pain                       [facility] Adult PT Treatment/Exercise - 10/26/19 0001      Exercises   Exercises  Knee/Hip      Knee/Hip Exercises:  Aerobic   Stationary Bike  Level 3 x 15 minutes.      Knee/Hip Exercises: Machines for Strengthening   Cybex Knee Extension  10# x 4 minutes.    Cybex Leg Press  2 plates x 4 minutes.      Acupuncturist Location  Left knee.    Electrical Stimulation Action  IFC    Electrical Stimulation Parameters  80-150 Hz x 15 minutes.    Electrical Stimulation Goals  Edema;Pain      Vasopneumatic   Number Minutes Vasopneumatic   15 minutes    Vasopnuematic Location   --   Left knee.   Vasopneumatic Pressure  Low                  PT Long Term Goals - 10/12/19 0834      PT LONG TERM GOAL #1   Title  Independent with a HEP.    Time  3    Status  On-going      PT LONG TERM GOAL #2   Title  Perform a reciprocating stair gait  with pain not > 2-3/10.    Time  3    Period  Weeks    Status  On-going            Plan - 10/26/19 1022    Clinical Impression Statement  Patient making excellent progress toward goals.    Examination-Activity Limitations  Stairs    Examination-Participation Restrictions  Other    Stability/Clinical Decision Making  Stable/Uncomplicated    Rehab Potential  Excellent    PT Frequency  2x / week    PT Duration  3 weeks    PT Treatment/Interventions  ADLs/Self Care Home Management;Cryotherapy;Electrical Stimulation;Ultrasound;Moist Heat;Stair training;Therapeutic activities;Therapeutic exercise;Manual techniques;Patient/family education;Passive range of motion;Vasopneumatic Device    PT Next Visit Plan  Nustep/stationary bike, pain-free left quadriceps strengthening.  OKC/CKC.  Vasopneumatic and electrical stimulation.    PT Home Exercise Plan  see patient education section    Consulted and Agree with Plan of Care  Patient       Patient will benefit from skilled therapeutic intervention in order to improve the following deficits and impairments:  Other (comment), Pain, Increased edema, Decreased activity tolerance,  Decreased range of motion  Visit Diagnosis: Acute pain of left knee  Stiffness of left knee, not elsewhere classified  Localized edema     Problem List Patient Active Problem List   Diagnosis Date Noted  . Suicide attempt ([place]) 04/07/2013  . Benzodiazepine overdose 04/07/2013  . Hypertension 04/07/2013  . Hyperlipidemia 04/07/2013  . Hypokalemia 04/07/2013  . Depression 04/07/2013  . Pulmonary nodule, right 12/27/2011    [name], [name] 10/26/2019, 10:51 AM  [facility] [address] [place], [place], [address] Phone: [phone]   Fax:  [phone]  Name: [name] MRN: [id number] Date of Birth: 04-06-1971

## 2019-10-28 ENCOUNTER — Ambulatory Visit: Payer: 59 | Admitting: Physical Therapy

## 2019-10-29 ENCOUNTER — Encounter: Payer: Self-pay | Admitting: Physical Therapy

## 2019-10-29 ENCOUNTER — Other Ambulatory Visit: Payer: Self-pay

## 2019-10-29 ENCOUNTER — Ambulatory Visit: Payer: 59 | Admitting: Physical Therapy

## 2019-10-29 DIAGNOSIS — M25662 Stiffness of left knee, not elsewhere classified: Secondary | ICD-10-CM

## 2019-10-29 DIAGNOSIS — M25562 Pain in left knee: Secondary | ICD-10-CM | POA: Diagnosis not present

## 2019-10-29 DIAGNOSIS — R600 Localized edema: Secondary | ICD-10-CM

## 2019-10-29 NOTE — Therapy (Signed)
[facility] [place], [place], [address] Phone: [phone]   Fax:  [phone]  Physical Therapy Treatment  Patient Details  Name: [name] MRN: [id number] Date of Birth: 11-May-1971 Referring Provider (PT): Latanya Maudlin MD   Encounter Date: 10/29/2019  PT End of Session - 10/29/19 0942    Visit Number  6    Number of Visits  6    Date for PT Re-Evaluation  10/29/19    Authorization Type  FOTO AT LEAST EVERY 5TH VISIT.    PT Start Time  0903    PT Stop Time  0948    PT Time Calculation (min)  45 min    Activity Tolerance  Patient tolerated treatment well    Behavior During Therapy  [facility] for tasks assessed/performed       Past Medical History:  Diagnosis Date  . Cervical cancer ([place])   . Depression   . GERD (gastroesophageal reflux disease)   . Hyperlipidemia   . Hypertension   . Peptic ulcer disease   . Urinary incontinence     Past Surgical History:  Procedure Laterality Date  . CARDIAC CATHETERIZATION    . CESAREAN SECTION    . COMBINED HYSTERECTOMY ABDOMINAL W/ MMK / BURCH PROCEDURE    . ENDOMETRIAL ABLATION  12/01/2004   Archie Endo 03/07/2011 (04/07/2013)  . LAPAROSCOPIC CHOLECYSTECTOMY  01/26/2004   Archie Endo 03/07/2011 (04/07/2013)  . TOTAL ABDOMINAL HYSTERECTOMY    . TUBAL LIGATION  08/21/2001   Archie Endo 03/07/2011 (04/07/2013)    There were no vitals filed for this visit.  Subjective Assessment - 10/29/19 0912    Subjective  COVID-19 screen performed prior to patient entering clinic.  No pain reported today and DC as she will return to work    Pertinent History  HTN.    Patient Stated Goals  Perform ADL's without pain.    Currently in Pain?  No/denies                       [facility] Adult PT Treatment/Exercise - 10/29/19 0001      Knee/Hip Exercises: Aerobic   Stationary Bike  Level 3 x 15 minutes.      Knee/Hip Exercises: Machines for Strengthening   Cybex Knee Extension  10# x 4  minutes.    Cybex Leg Press  2 plates x 4 minutes.      Acupuncturist Location  Left knee.    Chartered certified accountant  IFC    Electrical Stimulation Parameters  80-150hz  x48mn    Electrical Stimulation Goals  Other (comment)   requested     Vasopneumatic   Number Minutes Vasopneumatic   15 minutes    Vasopnuematic Location   Knee    Vasopneumatic Pressure  Low    Vasopneumatic Temperature   34 for edema and per request                  PT Long Term Goals - 10/29/19 0926      PT LONG TERM GOAL #1   Title  Independent with a HEP.    Time  3    Period  Weeks    Status  Achieved   Patient reported doing HEP and other exercises and does not require an updated one at this time 10/29/19     PT LONG TERM GOAL #2   Title  Perform a reciprocating stair gait with pain not > 2-3/10.  Time  3    Period  Weeks    Status  Achieved   no pain with ADL's, stairs or any other activities 10/29/19           Plan - 10/29/19 0943    Clinical Impression Statement  Patient met all goals and has no pain. Patient independent with HEP and going back to work next week.    Examination-Activity Limitations  Stairs    Examination-Participation Restrictions  Other    Stability/Clinical Decision Making  Stable/Uncomplicated    Rehab Potential  Excellent    PT Frequency  2x / week    PT Duration  3 weeks    PT Treatment/Interventions  ADLs/Self Care Home Management;Cryotherapy;Electrical Stimulation;Ultrasound;Moist Heat;Stair training;Therapeutic activities;Therapeutic exercise;Manual techniques;Patient/family education;Passive range of motion;Vasopneumatic Device    PT Next Visit Plan  DC    Consulted and Agree with Plan of Care  Patient       Patient will benefit from skilled therapeutic intervention in order to improve the following deficits and impairments:  Other (comment), Pain, Increased edema, Decreased activity tolerance, Decreased range  of motion  Visit Diagnosis: Acute pain of left knee  Stiffness of left knee, not elsewhere classified  Localized edema     Problem List Patient Active Problem List   Diagnosis Date Noted  . Suicide attempt ([place]) 04/07/2013  . Benzodiazepine overdose 04/07/2013  . Hypertension 04/07/2013  . Hyperlipidemia 04/07/2013  . Hypokalemia 04/07/2013  . Depression 04/07/2013  . Pulmonary nodule, right 12/27/2011   Ladean Raya, PTA 10/29/19 9:53 AM  [facility] [place], [place], [address] Phone: [phone]   Fax:  [phone]  Name: [name] MRN: [id number] Date of Birth: [birth year]  PHYSICAL THERAPY DISCHARGE SUMMARY  Visits from Start of Care: 6.  Current functional level related to goals / functional outcomes: See above.   Remaining deficits: Goals met   Education / Equipment: HEP. Plan: Patient agrees to discharge.  Patient goals were met. Patient is being discharged due to meeting the stated rehab goals.  ?????         Mali Applegate MPT

## 2019-11-04 DIAGNOSIS — E7800 Pure hypercholesterolemia, unspecified: Secondary | ICD-10-CM | POA: Diagnosis not present

## 2019-11-05 DIAGNOSIS — F4324 Adjustment disorder with disturbance of conduct: Secondary | ICD-10-CM | POA: Diagnosis not present

## 2019-11-11 MED FILL — SIMVASTATIN 20 MG TABLET: 20 | 90 days supply | Qty: 90 | Fill #0

## 2019-11-11 MED FILL — LOSARTAN-HCTZ 100-25 MG TAB: 100-25 | 90 days supply | Qty: 90 | Fill #1

## 2019-11-11 MED FILL — ESOMEPRAZOLE MAG DR 40 MG C: 40 | 90 days supply | Qty: 90 | Fill #0

## 2019-12-09 MED FILL — PREMARIN 0.625 MG TABLET: 0.625 | 90 days supply | Qty: 90 | Fill #0

## 2019-12-15 DIAGNOSIS — L570 Actinic keratosis: Secondary | ICD-10-CM | POA: Diagnosis not present

## 2019-12-15 DIAGNOSIS — L820 Inflamed seborrheic keratosis: Secondary | ICD-10-CM | POA: Diagnosis not present

## 2019-12-15 DIAGNOSIS — L738 Other specified follicular disorders: Secondary | ICD-10-CM | POA: Diagnosis not present

## 2020-01-19 MED FILL — OXYBUTYNIN CL ER 10 MG TAB: 10 | 90 days supply | Qty: 90 | Fill #2

## 2020-01-20 MED FILL — FLUoxetine HCL 40 MG CAPS: 40 | 90 days supply | Qty: 90 | Fill #0

## 2020-02-08 ENCOUNTER — Other Ambulatory Visit: Payer: Self-pay | Admitting: Family Medicine

## 2020-02-08 DIAGNOSIS — Z1231 Encounter for screening mammogram for malignant neoplasm of breast: Secondary | ICD-10-CM

## 2020-02-10 ENCOUNTER — Ambulatory Visit
Admission: RE | Admit: 2020-02-10 | Discharge: 2020-02-10 | Disposition: A | Discharge status: Home / Self Care | Payer: 59 | Source: Ambulatory Visit | Attending: Family Medicine | Admitting: Family Medicine

## 2020-02-10 ENCOUNTER — Other Ambulatory Visit: Payer: Self-pay

## 2020-02-10 DIAGNOSIS — Z1231 Encounter for screening mammogram for malignant neoplasm of breast: Secondary | ICD-10-CM

## 2020-02-16 MED FILL — LOSARTAN-HCTZ 100-25 MG TAB: 100-25 | 90 days supply | Qty: 90 | Fill #2

## 2020-02-16 MED FILL — ESOMEPRAZOLE MAG DR 40 MG C: 40 | 90 days supply | Qty: 90 | Fill #0

## 2020-02-16 MED FILL — SIMVASTATIN 20 MG TABLET: 20 | 90 days supply | Qty: 90 | Fill #1

## 2020-03-09 DIAGNOSIS — H5213 Myopia, bilateral: Secondary | ICD-10-CM | POA: Diagnosis not present

## 2020-03-14 MED FILL — PREMARIN 0.625 MG TABLET: 0.625 | 90 days supply | Qty: 90 | Fill #1

## 2020-04-26 DIAGNOSIS — E7800 Pure hypercholesterolemia, unspecified: Secondary | ICD-10-CM | POA: Diagnosis not present

## 2020-04-26 DIAGNOSIS — E669 Obesity, unspecified: Secondary | ICD-10-CM | POA: Diagnosis not present

## 2020-04-26 DIAGNOSIS — F320 Major depressive disorder, single episode, mild: Secondary | ICD-10-CM | POA: Diagnosis not present

## 2020-04-26 DIAGNOSIS — I10 Essential (primary) hypertension: Secondary | ICD-10-CM | POA: Diagnosis not present

## 2020-04-26 DIAGNOSIS — Z780 Asymptomatic menopausal state: Secondary | ICD-10-CM | POA: Diagnosis not present

## 2020-04-26 DIAGNOSIS — N3281 Overactive bladder: Secondary | ICD-10-CM | POA: Diagnosis not present

## 2020-04-26 DIAGNOSIS — K219 Gastro-esophageal reflux disease without esophagitis: Secondary | ICD-10-CM | POA: Diagnosis not present

## 2020-04-26 DIAGNOSIS — L720 Epidermal cyst: Secondary | ICD-10-CM | POA: Diagnosis not present

## 2020-04-26 DIAGNOSIS — J309 Allergic rhinitis, unspecified: Secondary | ICD-10-CM | POA: Diagnosis not present

## 2020-04-26 MED FILL — OXYBUTYNIN CL ER 10 MG TAB: 10 | 90 days supply | Qty: 90 | Fill #0

## 2020-04-26 MED FILL — FLUoxetine HCL 40 MG CAPS: 40 | 90 days supply | Qty: 90 | Fill #1

## 2020-05-04 DIAGNOSIS — F4324 Adjustment disorder with disturbance of conduct: Secondary | ICD-10-CM | POA: Diagnosis not present

## 2020-05-16 ENCOUNTER — Other Ambulatory Visit (HOSPITAL_COMMUNITY): Payer: Self-pay | Admitting: Family Medicine

## 2020-05-16 MED FILL — LOSARTAN-HCTZ 100-25 MG TAB: 100-25 | 90 days supply | Qty: 90 | Fill #0

## 2020-05-16 MED FILL — ESOMEPRAZOLE MAG DR 40 MG C: 40 | 90 days supply | Qty: 90 | Fill #0

## 2020-05-16 MED FILL — SIMVASTATIN 20 MG TABLET: 20 | 90 days supply | Qty: 90 | Fill #0

## 2020-06-17 MED FILL — PREMARIN 0.625 MG TABLET: 0.625 | 90 days supply | Qty: 90 | Fill #2

## 2020-06-22 DIAGNOSIS — Z20828 Contact with and (suspected) exposure to other viral communicable diseases: Secondary | ICD-10-CM | POA: Diagnosis not present

## 2020-07-26 MED FILL — OXYBUTYNIN CL ER 10 MG TAB: 10 | 90 days supply | Qty: 90 | Fill #1

## 2020-07-28 ENCOUNTER — Other Ambulatory Visit (HOSPITAL_COMMUNITY): Payer: Self-pay | Admitting: Adult Health Nurse Practitioner

## 2020-07-28 MED FILL — FLUoxetine HCL 40 MG CAPS: 40 | 90 days supply | Qty: 90 | Fill #0

## 2020-08-16 ENCOUNTER — Other Ambulatory Visit (HOSPITAL_COMMUNITY): Payer: Self-pay | Admitting: Obstetrics and Gynecology

## 2020-08-16 DIAGNOSIS — Z01419 Encounter for gynecological examination (general) (routine) without abnormal findings: Secondary | ICD-10-CM | POA: Diagnosis not present

## 2020-08-16 MED FILL — MYRBETRIQ ER 25 MG TABLET: 25 | 30 days supply | Qty: 30 | Fill #0

## 2020-08-18 MED FILL — ESOMEPRAZOLE MAG DR 40 MG C: 40 | 90 days supply | Qty: 90 | Fill #1

## 2020-08-18 MED FILL — SIMVASTATIN 20 MG TABLET: 20 | 90 days supply | Qty: 90 | Fill #1

## 2020-08-18 MED FILL — LOSARTAN-HCTZ 100-25 MG TAB: 100-25 | 90 days supply | Qty: 90 | Fill #1

## 2020-09-19 ENCOUNTER — Other Ambulatory Visit (HOSPITAL_COMMUNITY): Payer: Self-pay | Admitting: Family Medicine

## 2020-09-20 MED FILL — PREMARIN 0.625 MG TABLET: 0.625 | 90 days supply | Qty: 90 | Fill #0

## 2020-09-21 MED FILL — MYRBETRIQ ER 25 MG TABLET: 25 | 30 days supply | Qty: 30 | Fill #1

## 2020-09-29 ENCOUNTER — Ambulatory Visit: Payer: 59

## 2020-10-25 MED FILL — MYRBETRIQ ER 25 MG TABLET: 25 | 30 days supply | Qty: 30 | Fill #2

## 2020-10-25 MED FILL — FLUoxetine HCL 40 MG CAPS: 40 | 90 days supply | Qty: 90 | Fill #1

## 2020-11-02 DIAGNOSIS — F4324 Adjustment disorder with disturbance of conduct: Secondary | ICD-10-CM | POA: Diagnosis not present

## 2020-11-03 DIAGNOSIS — J309 Allergic rhinitis, unspecified: Secondary | ICD-10-CM | POA: Diagnosis not present

## 2020-11-03 DIAGNOSIS — E7800 Pure hypercholesterolemia, unspecified: Secondary | ICD-10-CM | POA: Diagnosis not present

## 2020-11-03 DIAGNOSIS — K219 Gastro-esophageal reflux disease without esophagitis: Secondary | ICD-10-CM | POA: Diagnosis not present

## 2020-11-03 DIAGNOSIS — N3281 Overactive bladder: Secondary | ICD-10-CM | POA: Diagnosis not present

## 2020-11-03 DIAGNOSIS — E669 Obesity, unspecified: Secondary | ICD-10-CM | POA: Diagnosis not present

## 2020-11-03 DIAGNOSIS — F320 Major depressive disorder, single episode, mild: Secondary | ICD-10-CM | POA: Diagnosis not present

## 2020-11-03 DIAGNOSIS — I10 Essential (primary) hypertension: Secondary | ICD-10-CM | POA: Diagnosis not present

## 2020-11-03 DIAGNOSIS — Z780 Asymptomatic menopausal state: Secondary | ICD-10-CM | POA: Diagnosis not present

## 2020-11-22 ENCOUNTER — Other Ambulatory Visit (HOSPITAL_COMMUNITY): Payer: Self-pay | Admitting: Family Medicine

## 2020-11-22 MED FILL — SIMVASTATIN 20 MG TABLET: 20 | 90 days supply | Qty: 90 | Fill #0

## 2020-11-22 MED FILL — ESOMEPRAZOLE MAG DR 40 MG C: 40 | 90 days supply | Qty: 90 | Fill #0

## 2020-11-22 MED FILL — LOSARTAN-HCTZ 100-25 MG TAB: 100-25 | 30 days supply | Qty: 30 | Fill #2

## 2020-11-22 MED FILL — MYRBETRIQ ER 25 MG TABLET: 25 | 30 days supply | Qty: 30 | Fill #3

## 2020-12-22 DIAGNOSIS — D1801 Hemangioma of skin and subcutaneous tissue: Secondary | ICD-10-CM | POA: Diagnosis not present

## 2020-12-22 DIAGNOSIS — L918 Other hypertrophic disorders of the skin: Secondary | ICD-10-CM | POA: Diagnosis not present

## 2020-12-22 DIAGNOSIS — D235 Other benign neoplasm of skin of trunk: Secondary | ICD-10-CM | POA: Diagnosis not present

## 2020-12-23 ENCOUNTER — Other Ambulatory Visit (HOSPITAL_COMMUNITY): Payer: Self-pay | Admitting: Family Medicine

## 2020-12-23 MED FILL — MYRBETRIQ ER 25 MG TABLET: 25 | 30 days supply | Qty: 30 | Fill #4

## 2020-12-23 MED FILL — PREMARIN 0.625 MG TABLET: 0.625 | 90 days supply | Qty: 90 | Fill #0

## 2020-12-23 MED FILL — LOSARTAN-HCTZ 100-25 MG TAB: 100-25 | 30 days supply | Qty: 30 | Fill #3

## 2021-01-25 ENCOUNTER — Other Ambulatory Visit (HOSPITAL_COMMUNITY): Payer: Self-pay

## 2021-01-25 MED ORDER — LOSARTAN POTASSIUM-HCTZ 100-25 MG PO TABS
1.0000 | ORAL_TABLET | Freq: Every day | ORAL | 1 refills | Status: DC
  Filled 2021-01-25: qty 30, 30d supply, fill #0
  Filled 2021-02-22: qty 90, 90d supply, fill #1
  Filled 2021-05-31: qty 60, 60d supply, fill #2

## 2021-01-25 MED FILL — Mirabegron Tab ER 24 HR 25 MG: ORAL | 90 days supply | Qty: 90 | Fill #0 | Status: AC

## 2021-01-25 MED FILL — Fluoxetine HCl Cap 40 MG: ORAL | 90 days supply | Qty: 90 | Fill #0 | Status: AC

## 2021-01-26 ENCOUNTER — Other Ambulatory Visit (HOSPITAL_COMMUNITY): Payer: Self-pay

## 2021-02-21 ENCOUNTER — Other Ambulatory Visit (HOSPITAL_COMMUNITY): Payer: Self-pay

## 2021-02-22 ENCOUNTER — Other Ambulatory Visit (HOSPITAL_COMMUNITY): Payer: Self-pay

## 2021-02-22 MED FILL — Esomeprazole Magnesium Cap Delayed Release 40 MG (Base Eq): ORAL | 90 days supply | Qty: 90 | Fill #0 | Status: AC

## 2021-02-22 MED FILL — Simvastatin Tab 20 MG: ORAL | 90 days supply | Qty: 90 | Fill #0 | Status: AC

## 2021-03-13 DIAGNOSIS — H5213 Myopia, bilateral: Secondary | ICD-10-CM | POA: Diagnosis not present

## 2021-03-15 ENCOUNTER — Other Ambulatory Visit: Payer: Self-pay | Admitting: Lab

## 2021-03-15 ENCOUNTER — Other Ambulatory Visit: Payer: Self-pay | Admitting: Family Medicine

## 2021-03-15 DIAGNOSIS — Z1231 Encounter for screening mammogram for malignant neoplasm of breast: Secondary | ICD-10-CM

## 2021-03-20 MED FILL — Estrogens, Conjugated Tab 0.625 MG: ORAL | 90 days supply | Qty: 90 | Fill #0 | Status: AC

## 2021-03-21 ENCOUNTER — Other Ambulatory Visit (HOSPITAL_COMMUNITY): Payer: Self-pay

## 2021-03-22 ENCOUNTER — Other Ambulatory Visit (HOSPITAL_COMMUNITY): Payer: Self-pay

## 2021-03-29 ENCOUNTER — Other Ambulatory Visit (HOSPITAL_COMMUNITY): Payer: Self-pay

## 2021-03-31 ENCOUNTER — Other Ambulatory Visit (HOSPITAL_COMMUNITY): Payer: Self-pay

## 2021-04-30 ENCOUNTER — Other Ambulatory Visit (HOSPITAL_COMMUNITY): Payer: Self-pay

## 2021-04-30 MED FILL — Mirabegron Tab ER 24 HR 25 MG: ORAL | 90 days supply | Qty: 90 | Fill #1 | Status: AC

## 2021-05-01 ENCOUNTER — Other Ambulatory Visit (HOSPITAL_COMMUNITY): Payer: Self-pay

## 2021-05-02 ENCOUNTER — Other Ambulatory Visit (HOSPITAL_COMMUNITY): Payer: Self-pay

## 2021-05-03 ENCOUNTER — Other Ambulatory Visit (HOSPITAL_COMMUNITY): Payer: Self-pay

## 2021-05-03 DIAGNOSIS — F4324 Adjustment disorder with disturbance of conduct: Secondary | ICD-10-CM | POA: Diagnosis not present

## 2021-05-03 MED ORDER — FLUOXETINE HCL 40 MG PO CAPS
40.0000 mg | ORAL_CAPSULE | Freq: Every morning | ORAL | 2 refills | Status: DC
  Filled 2021-05-03: qty 90, 90d supply, fill #0
  Filled 2021-07-31: qty 90, 90d supply, fill #1
  Filled 2021-10-31: qty 90, 90d supply, fill #2

## 2021-05-10 DIAGNOSIS — N3281 Overactive bladder: Secondary | ICD-10-CM | POA: Diagnosis not present

## 2021-05-10 DIAGNOSIS — L723 Sebaceous cyst: Secondary | ICD-10-CM | POA: Diagnosis not present

## 2021-05-10 DIAGNOSIS — K219 Gastro-esophageal reflux disease without esophagitis: Secondary | ICD-10-CM | POA: Diagnosis not present

## 2021-05-10 DIAGNOSIS — J309 Allergic rhinitis, unspecified: Secondary | ICD-10-CM | POA: Diagnosis not present

## 2021-05-10 DIAGNOSIS — F320 Major depressive disorder, single episode, mild: Secondary | ICD-10-CM | POA: Diagnosis not present

## 2021-05-10 DIAGNOSIS — E7800 Pure hypercholesterolemia, unspecified: Secondary | ICD-10-CM | POA: Diagnosis not present

## 2021-05-10 DIAGNOSIS — Z780 Asymptomatic menopausal state: Secondary | ICD-10-CM | POA: Diagnosis not present

## 2021-05-10 DIAGNOSIS — I10 Essential (primary) hypertension: Secondary | ICD-10-CM | POA: Diagnosis not present

## 2021-05-10 DIAGNOSIS — E669 Obesity, unspecified: Secondary | ICD-10-CM | POA: Diagnosis not present

## 2021-05-12 ENCOUNTER — Other Ambulatory Visit: Payer: Self-pay

## 2021-05-12 ENCOUNTER — Ambulatory Visit
Admission: RE | Admit: 2021-05-12 | Discharge: 2021-05-12 | Disposition: A | Discharge status: Home / Self Care | Payer: 59 | Source: Ambulatory Visit | Attending: Family Medicine | Admitting: Family Medicine

## 2021-05-12 DIAGNOSIS — Z1231 Encounter for screening mammogram for malignant neoplasm of breast: Secondary | ICD-10-CM | POA: Diagnosis not present

## 2021-05-16 ENCOUNTER — Other Ambulatory Visit: Payer: Self-pay | Admitting: Family Medicine

## 2021-05-16 DIAGNOSIS — R928 Other abnormal and inconclusive findings on diagnostic imaging of breast: Secondary | ICD-10-CM

## 2021-05-17 ENCOUNTER — Ambulatory Visit
Admission: RE | Admit: 2021-05-17 | Discharge: 2021-05-17 | Disposition: A | Discharge status: Home / Self Care | Payer: 59 | Source: Ambulatory Visit | Attending: Family Medicine | Admitting: Family Medicine

## 2021-05-17 ENCOUNTER — Other Ambulatory Visit: Payer: Self-pay | Admitting: Family Medicine

## 2021-05-17 ENCOUNTER — Other Ambulatory Visit: Payer: Self-pay

## 2021-05-17 DIAGNOSIS — R928 Other abnormal and inconclusive findings on diagnostic imaging of breast: Secondary | ICD-10-CM

## 2021-05-17 DIAGNOSIS — R921 Mammographic calcification found on diagnostic imaging of breast: Secondary | ICD-10-CM | POA: Diagnosis not present

## 2021-05-31 ENCOUNTER — Other Ambulatory Visit (HOSPITAL_COMMUNITY): Payer: Self-pay

## 2021-06-01 ENCOUNTER — Other Ambulatory Visit (HOSPITAL_COMMUNITY): Payer: Self-pay

## 2021-06-01 MED ORDER — SIMVASTATIN 20 MG PO TABS
20.0000 mg | ORAL_TABLET | Freq: Every day | ORAL | 2 refills | Status: DC
  Filled 2021-06-01: qty 90, 90d supply, fill #0
  Filled 2021-09-05: qty 90, 90d supply, fill #1
  Filled 2021-12-11: qty 90, 90d supply, fill #2

## 2021-06-01 MED ORDER — ESOMEPRAZOLE MAGNESIUM 40 MG PO CPDR
40.0000 mg | DELAYED_RELEASE_CAPSULE | Freq: Every day | ORAL | 2 refills | Status: DC
  Filled 2021-06-01: qty 90, 90d supply, fill #0
  Filled 2021-09-05: qty 90, 90d supply, fill #1
  Filled 2021-12-11: qty 90, 90d supply, fill #2

## 2021-06-02 ENCOUNTER — Other Ambulatory Visit (HOSPITAL_COMMUNITY): Payer: Self-pay

## 2021-06-02 MED ORDER — LOSARTAN POTASSIUM-HCTZ 100-25 MG PO TABS
1.0000 | ORAL_TABLET | Freq: Every day | ORAL | 1 refills | Status: DC
  Filled 2021-06-02 – 2021-07-31 (×3): qty 90, 90d supply, fill #0
  Filled 2021-10-31: qty 90, 90d supply, fill #1

## 2021-06-05 ENCOUNTER — Other Ambulatory Visit (HOSPITAL_COMMUNITY): Payer: Self-pay

## 2021-06-30 ENCOUNTER — Other Ambulatory Visit (HOSPITAL_COMMUNITY): Payer: Self-pay

## 2021-07-04 ENCOUNTER — Other Ambulatory Visit (HOSPITAL_COMMUNITY): Payer: Self-pay

## 2021-07-04 MED ORDER — PREMARIN 0.625 MG PO TABS
0.6250 mg | ORAL_TABLET | Freq: Every day | ORAL | 1 refills | Status: DC
  Filled 2021-07-04: qty 90, 90d supply, fill #0
  Filled 2021-10-03: qty 90, 90d supply, fill #1

## 2021-07-31 ENCOUNTER — Other Ambulatory Visit (HOSPITAL_COMMUNITY): Payer: Self-pay

## 2021-07-31 MED FILL — Mirabegron Tab ER 24 HR 25 MG: ORAL | 30 days supply | Qty: 30 | Fill #2 | Status: AC

## 2021-08-15 DIAGNOSIS — D3617 Benign neoplasm of peripheral nerves and autonomic nervous system of trunk, unspecified: Secondary | ICD-10-CM | POA: Diagnosis not present

## 2021-08-15 DIAGNOSIS — D485 Neoplasm of uncertain behavior of skin: Secondary | ICD-10-CM | POA: Diagnosis not present

## 2021-08-17 ENCOUNTER — Other Ambulatory Visit (HOSPITAL_COMMUNITY): Payer: Self-pay

## 2021-08-17 DIAGNOSIS — Z01419 Encounter for gynecological examination (general) (routine) without abnormal findings: Secondary | ICD-10-CM | POA: Diagnosis not present

## 2021-08-17 MED ORDER — MYRBETRIQ 25 MG PO TB24
25.0000 mg | ORAL_TABLET | Freq: Every day | ORAL | 3 refills | Status: AC
  Filled 2021-08-17: qty 90, 90d supply, fill #0
  Filled 2021-12-11: qty 90, 90d supply, fill #1
  Filled 2022-03-19: qty 90, 90d supply, fill #2
  Filled 2022-06-14: qty 90, 90d supply, fill #3

## 2021-08-21 ENCOUNTER — Other Ambulatory Visit (HOSPITAL_COMMUNITY): Payer: Self-pay

## 2021-08-22 ENCOUNTER — Other Ambulatory Visit (HOSPITAL_COMMUNITY): Payer: Self-pay

## 2021-08-22 DIAGNOSIS — B349 Viral infection, unspecified: Secondary | ICD-10-CM | POA: Diagnosis not present

## 2021-08-22 DIAGNOSIS — R059 Cough, unspecified: Secondary | ICD-10-CM | POA: Diagnosis not present

## 2021-08-22 MED ORDER — HYDROCOD POLST-CPM POLST ER 10-8 MG/5ML PO SUER
2.5000 mL | Freq: Two times a day (BID) | ORAL | 0 refills | Status: DC
  Filled 2021-08-22: qty 50, 5d supply, fill #0

## 2021-08-22 MED ORDER — PREDNISONE 20 MG PO TABS
ORAL_TABLET | ORAL | 0 refills | Status: DC
  Filled 2021-08-22: qty 18, 9d supply, fill #0

## 2021-08-24 ENCOUNTER — Other Ambulatory Visit (HOSPITAL_COMMUNITY): Payer: Self-pay

## 2021-08-28 ENCOUNTER — Other Ambulatory Visit (HOSPITAL_COMMUNITY): Payer: Self-pay

## 2021-09-05 ENCOUNTER — Other Ambulatory Visit (HOSPITAL_COMMUNITY): Payer: Self-pay

## 2021-10-04 ENCOUNTER — Other Ambulatory Visit (HOSPITAL_COMMUNITY): Payer: Self-pay

## 2021-10-26 ENCOUNTER — Other Ambulatory Visit (HOSPITAL_COMMUNITY): Payer: Self-pay

## 2021-10-26 DIAGNOSIS — M25561 Pain in right knee: Secondary | ICD-10-CM | POA: Diagnosis not present

## 2021-10-26 MED ORDER — PREDNISONE 10 MG PO TABS
ORAL_TABLET | ORAL | 0 refills | Status: AC
  Filled 2021-10-26: qty 21, 12d supply, fill #0

## 2021-10-31 ENCOUNTER — Other Ambulatory Visit (HOSPITAL_COMMUNITY): Payer: Self-pay

## 2021-11-01 DIAGNOSIS — F4324 Adjustment disorder with disturbance of conduct: Secondary | ICD-10-CM | POA: Diagnosis not present

## 2021-11-03 ENCOUNTER — Other Ambulatory Visit (HOSPITAL_COMMUNITY): Payer: Self-pay

## 2021-11-04 DIAGNOSIS — M238X1 Other internal derangements of right knee: Secondary | ICD-10-CM | POA: Diagnosis not present

## 2021-11-04 DIAGNOSIS — M25561 Pain in right knee: Secondary | ICD-10-CM | POA: Diagnosis not present

## 2021-11-10 DIAGNOSIS — Z0000 Encounter for general adult medical examination without abnormal findings: Secondary | ICD-10-CM | POA: Diagnosis not present

## 2021-11-10 DIAGNOSIS — N3281 Overactive bladder: Secondary | ICD-10-CM | POA: Diagnosis not present

## 2021-11-10 DIAGNOSIS — J309 Allergic rhinitis, unspecified: Secondary | ICD-10-CM | POA: Diagnosis not present

## 2021-11-10 DIAGNOSIS — E7800 Pure hypercholesterolemia, unspecified: Secondary | ICD-10-CM | POA: Diagnosis not present

## 2021-11-10 DIAGNOSIS — Z780 Asymptomatic menopausal state: Secondary | ICD-10-CM | POA: Diagnosis not present

## 2021-11-10 DIAGNOSIS — I10 Essential (primary) hypertension: Secondary | ICD-10-CM | POA: Diagnosis not present

## 2021-11-10 DIAGNOSIS — Z23 Encounter for immunization: Secondary | ICD-10-CM | POA: Diagnosis not present

## 2021-11-10 DIAGNOSIS — F320 Major depressive disorder, single episode, mild: Secondary | ICD-10-CM | POA: Diagnosis not present

## 2021-11-10 DIAGNOSIS — E669 Obesity, unspecified: Secondary | ICD-10-CM | POA: Diagnosis not present

## 2021-11-10 DIAGNOSIS — K219 Gastro-esophageal reflux disease without esophagitis: Secondary | ICD-10-CM | POA: Diagnosis not present

## 2021-11-13 ENCOUNTER — Other Ambulatory Visit (HOSPITAL_COMMUNITY): Payer: Self-pay

## 2021-11-13 MED ORDER — SCOPOLAMINE 1 MG/3DAYS TD PT72
1.0000 | MEDICATED_PATCH | TRANSDERMAL | 0 refills | Status: DC
  Filled 2021-11-13 – 2021-11-21 (×2): qty 3, 9d supply, fill #0

## 2021-11-20 ENCOUNTER — Ambulatory Visit
Admission: RE | Admit: 2021-11-20 | Discharge: 2021-11-20 | Disposition: A | Discharge status: Home / Self Care | Payer: 59 | Source: Ambulatory Visit | Attending: Family Medicine | Admitting: Family Medicine

## 2021-11-20 DIAGNOSIS — R928 Other abnormal and inconclusive findings on diagnostic imaging of breast: Secondary | ICD-10-CM

## 2021-11-20 DIAGNOSIS — R922 Inconclusive mammogram: Secondary | ICD-10-CM | POA: Diagnosis not present

## 2021-11-21 ENCOUNTER — Other Ambulatory Visit (HOSPITAL_COMMUNITY): Payer: Self-pay

## 2021-11-22 ENCOUNTER — Other Ambulatory Visit (HOSPITAL_COMMUNITY): Payer: Self-pay

## 2021-12-12 ENCOUNTER — Other Ambulatory Visit (HOSPITAL_COMMUNITY): Payer: Self-pay

## 2022-01-10 ENCOUNTER — Encounter: Payer: Self-pay | Admitting: Internal Medicine

## 2022-01-14 ENCOUNTER — Other Ambulatory Visit (HOSPITAL_COMMUNITY): Payer: Self-pay

## 2022-01-15 ENCOUNTER — Other Ambulatory Visit (HOSPITAL_COMMUNITY): Payer: Self-pay

## 2022-01-15 MED ORDER — PREMARIN 0.625 MG PO TABS
0.6250 mg | ORAL_TABLET | Freq: Every day | ORAL | 1 refills | Status: DC
  Filled 2022-01-15: qty 90, 90d supply, fill #0
  Filled 2022-04-16: qty 90, 90d supply, fill #1

## 2022-01-24 ENCOUNTER — Other Ambulatory Visit (HOSPITAL_COMMUNITY): Payer: Self-pay

## 2022-01-25 ENCOUNTER — Other Ambulatory Visit (HOSPITAL_COMMUNITY): Payer: Self-pay

## 2022-01-25 MED ORDER — LOSARTAN POTASSIUM-HCTZ 100-25 MG PO TABS
1.0000 | ORAL_TABLET | Freq: Every day | ORAL | 1 refills | Status: DC
  Filled 2022-01-25: qty 90, 90d supply, fill #0
  Filled 2022-05-01: qty 90, 90d supply, fill #1

## 2022-01-25 MED ORDER — FLUOXETINE HCL 40 MG PO CAPS
40.0000 mg | ORAL_CAPSULE | Freq: Every morning | ORAL | 2 refills | Status: DC
  Filled 2022-01-25: qty 90, 90d supply, fill #0
  Filled 2022-05-01: qty 90, 90d supply, fill #1
  Filled 2022-07-26: qty 90, 90d supply, fill #2

## 2022-03-02 ENCOUNTER — Other Ambulatory Visit (HOSPITAL_COMMUNITY): Payer: Self-pay

## 2022-03-02 ENCOUNTER — Ambulatory Visit (AMBULATORY_SURGERY_CENTER): Payer: 59

## 2022-03-02 VITALS — Ht 67.0 in | Wt 249.0 lb

## 2022-03-02 DIAGNOSIS — Z1211 Encounter for screening for malignant neoplasm of colon: Secondary | ICD-10-CM

## 2022-03-02 MED ORDER — NA SULFATE-K SULFATE-MG SULF 17.5-3.13-1.6 GM/177ML PO SOLN
1.0000 | Freq: Once | ORAL | 0 refills | Status: AC
  Filled 2022-03-02: qty 354, 1d supply, fill #0

## 2022-03-02 NOTE — Progress Notes (Signed)
No egg or soy allergy known to patient  ?No issues known to pt with past sedation with any surgeries or procedures ?Patient denies ever being told they had issues or difficulty with intubation  ?No FH of Malignant Hyperthermia ?Pt is not on diet pills ?Pt is not on home 02  ?Pt is not on blood thinners  ?Pt denies issues with constipation  ?No A fib or A flutter ?NO PA's for preps discussed with pt in PV today  ?Discussed with pt there will be an out-of-pocket cost for prep and that varies from $0 to 70 + dollars - pt verbalized understanding  ?Pt instructed to use Singlecare.com or GoodRx for a price reduction on prep  ?PV completed over the phone. Pt verified name, DOB, address and insurance during PV today.  ?Pt mailed instruction packet with copy of consent form to read and not return, and instructions.  ?Pt encouraged to call with questions or issues.  ?If pt has My chart, procedure instructions sent via My Chart  ?Insurance confirmed with pt at [facility] today  ? ?

## 2022-03-09 ENCOUNTER — Other Ambulatory Visit (HOSPITAL_COMMUNITY): Payer: Self-pay

## 2022-03-13 ENCOUNTER — Encounter: Payer: Self-pay | Admitting: Internal Medicine

## 2022-03-16 ENCOUNTER — Encounter: Payer: Self-pay | Admitting: Internal Medicine

## 2022-03-16 ENCOUNTER — Ambulatory Visit (AMBULATORY_SURGERY_CENTER): Payer: 59 | Admitting: Internal Medicine

## 2022-03-16 VITALS — BP 117/68 | HR 71 | Temp 97.1°F | Resp 19 | Ht 67.0 in | Wt 249.0 lb

## 2022-03-16 DIAGNOSIS — D122 Benign neoplasm of ascending colon: Secondary | ICD-10-CM | POA: Diagnosis not present

## 2022-03-16 DIAGNOSIS — Z1211 Encounter for screening for malignant neoplasm of colon: Secondary | ICD-10-CM | POA: Diagnosis not present

## 2022-03-16 DIAGNOSIS — D120 Benign neoplasm of cecum: Secondary | ICD-10-CM

## 2022-03-16 DIAGNOSIS — D123 Benign neoplasm of transverse colon: Secondary | ICD-10-CM | POA: Diagnosis not present

## 2022-03-16 MED ORDER — SODIUM CHLORIDE 0.9 % IV SOLN: 500.0000 mL | Freq: Once | INTRAVENOUS | Status: AC

## 2022-03-16 NOTE — Op Note (Signed)
[place] Patient Name: [name] Procedure Date: 03/16/2022 8:51 AM MRN: [id number] Endoscopist: Jerene Bears , MD Age: 51 Referring MD:  Date of Birth: 04-Dec-1970 Gender: Female Account #: [id number] Procedure:                Colonoscopy Indications:              Screening for colorectal malignant neoplasm, This                            is the patient's first colonoscopy Medicines:                Monitored Anesthesia Care Procedure:                Pre-Anesthesia Assessment:                           - Prior to the procedure, a History and Physical                            was performed, and patient medications and                            allergies were reviewed. The patient's tolerance of                            previous anesthesia was also reviewed. The risks                            and benefits of the procedure and the sedation                            options and risks were discussed with the patient.                            All questions were answered, and informed consent                            was obtained. Prior Anticoagulants: The patient has                            taken no previous anticoagulant or antiplatelet                            agents. ASA Grade Assessment: II - A patient with                            mild systemic disease. After reviewing the risks                            and benefits, the patient was deemed in                            satisfactory condition to undergo the procedure.  After obtaining informed consent, the colonoscope                            was passed under direct vision. Throughout the                            procedure, the patient's blood pressure, pulse, and                            oxygen saturations were monitored continuously. The                            CF [id number] #[id number] was introduced through the anus                            and advanced to the  cecum, identified by                            appendiceal orifice and ileocecal valve. The                            colonoscopy was performed without difficulty. The                            patient tolerated the procedure well. The quality                            of the bowel preparation was good. The ileocecal                            valve, appendiceal orifice, and rectum were                            photographed. Scope In: 8:55:38 AM Scope Out: 9:14:24 AM Scope Withdrawal Time: 0 hours 16 minutes 6 seconds  Total Procedure Duration: 0 hours 18 minutes 46 seconds  Findings:                 The digital rectal exam was normal.                           A 7 mm polyp was found in the cecum. The polyp was                            sessile. The polyp was removed with a cold snare.                            Resection and retrieval were complete.                           Two sessile polyps were found in the ascending                            colon. The polyps were 9 to 10 mm in  size. These                            polyps were removed with a cold snare. Resection                            and retrieval were complete.                           Two sessile polyps were found in the transverse                            colon. The polyps were 7 to 9 mm in size. These                            polyps were removed with a cold snare. Resection                            and retrieval were complete.                           The retroflexed view of the distal rectum and anal                            verge was normal and showed no anal or rectal                            abnormalities. Complications:            No immediate complications. Estimated Blood Loss:     Estimated blood loss was minimal. Impression:               - One 7 mm polyp in the cecum, removed with a cold                            snare. Resected and retrieved.                           - Two 9 to 10 mm  polyps in the ascending colon,                            removed with a cold snare. Resected and retrieved.                           - Two 7 to 9 mm polyps in the transverse colon,                            removed with a cold snare. Resected and retrieved.                           - The distal rectum and anal verge are normal on                            retroflexion view.  Recommendation:           - Patient has a contact number available for                            emergencies. The signs and symptoms of potential                            delayed complications were discussed with the                            patient. Return to normal activities tomorrow.                            Written discharge instructions were provided to the                            patient.                           - Resume previous diet.                           - Continue present medications.                           - Await pathology results.                           - Repeat colonoscopy is recommended for                            surveillance. The colonoscopy date will be                            determined after pathology results from today's                            exam become available for review. Jerene Bears, MD 03/16/2022 9:16:39 AM This report has been signed electronically.

## 2022-03-16 NOTE — Patient Instructions (Signed)
Impression/Recommendations:  Polyp handout given to patient.  Resume previous diet. Continue present medications. Await pathology results.  Repeat colonoscopy recommended for surveillance.  Date to be determined after pathology results reviewed.  YOU HAD AN ENDOSCOPIC PROCEDURE TODAY AT [facility]:   Refer to the procedure report that was given to you for any specific questions about what was found during the examination.  If the procedure report does not answer your questions, please call your gastroenterologist to clarify.  If you requested that your care partner not be given the details of your procedure findings, then the procedure report has been included in a sealed envelope for you to review at your convenience later.  YOU SHOULD EXPECT: Some feelings of bloating in the abdomen. Passage of more gas than usual.  Walking can help get rid of the air that was put into your GI tract during the procedure and reduce the bloating. If you had a lower endoscopy (such as a colonoscopy or flexible sigmoidoscopy) you may notice spotting of blood in your stool or on the toilet paper. If you underwent a bowel prep for your procedure, you may not have a normal bowel movement for a few days.  Please Note:  You might notice some irritation and congestion in your nose or some drainage.  This is from the oxygen used during your procedure.  There is no need for concern and it should clear up in a day or so.  SYMPTOMS TO REPORT IMMEDIATELY:  Following lower endoscopy (colonoscopy or flexible sigmoidoscopy):  Excessive amounts of blood in the stool  Significant tenderness or worsening of abdominal pains  Swelling of the abdomen that is new, acute  Fever of 100F or higher  For urgent or emergent issues, a gastroenterologist can be reached at any hour by calling [phone]. Do not use MyChart messaging for urgent concerns.    DIET:  We do recommend a small meal at first, but then you  may proceed to your regular diet.  Drink plenty of fluids but you should avoid alcoholic beverages for 24 hours.  ACTIVITY:  You should plan to take it easy for the rest of today and you should NOT DRIVE or use heavy machinery until tomorrow (because of the sedation medicines used during the test).    FOLLOW UP: Our staff will call the number listed on your records 48-72 hours following your procedure to check on you and address any questions or concerns that you may have regarding the information given to you following your procedure. If we do not reach you, we will leave a message.  We will attempt to reach you two times.  During this call, we will ask if you have developed any symptoms of COVID 19. If you develop any symptoms (ie: fever, flu-like symptoms, shortness of breath, cough etc.) before then, please call [phone].  If you test positive for Covid 19 in the 2 weeks post procedure, please call and report this information to [place].    If any biopsies were taken you will be contacted by phone or by letter within the next 1-3 weeks.  Please call us at [phone] if you have not heard about the biopsies in 3 weeks.    SIGNATURES/CONFIDENTIALITY: You and/or your care partner have signed paperwork which will be entered into your electronic medical record.  These signatures attest to the fact that that the information above on your After Visit Summary has been reviewed and is understood.  Full responsibility  of the confidentiality of this discharge information lies with you and/or your care-partner.

## 2022-03-16 NOTE — Progress Notes (Signed)
To pacu, VSS. Report to [email or web address] 

## 2022-03-16 NOTE — Progress Notes (Signed)
GASTROENTEROLOGY PROCEDURE H&P NOTE   Primary Care Physician: Patient, No Pcp Per (Inactive)    Reason for Procedure:  Colorectal cancer screening  Plan:    Colonoscopy  Patient is appropriate for endoscopic procedure(s) in the ambulatory ([facility]) setting.  The nature of the procedure, as well as the risks, benefits, and alternatives were carefully and thoroughly reviewed with the patient. Ample time for discussion and questions allowed. The patient understood, was satisfied, and agreed to proceed.     HPI: [name] is a 51 y.o. female who presents for colonoscopy.  Medical history as below.  Tolerated the prep.  No recent chest pain or shortness of breath.  No abdominal pain today.  Past Medical History:  Diagnosis Date   Cervical cancer ([place]) 1995   Depression    on meds   GERD (gastroesophageal reflux disease)    on meds   Hyperlipidemia    on meds   Hypertension    on meds   Peptic ulcer disease    Seasonal allergies    Urinary incontinence     Past Surgical History:  Procedure Laterality Date   CARDIAC CATHETERIZATION     CESAREAN SECTION     COMBINED HYSTERECTOMY ABDOMINAL W/ MMK / BURCH PROCEDURE     ENDOMETRIAL ABLATION  12/01/2004   [facility] Endo 03/07/2011 (04/07/2013)   KNEE ARTHROSCOPY W/ MENISCAL REPAIR Left 2020   LAPAROSCOPIC CHOLECYSTECTOMY  01/26/2004   Archie Endo 03/07/2011 (04/07/2013)   TUBAL LIGATION  08/21/2001   [facility] Endo 03/07/2011 (04/07/2013)   UPPER GASTROINTESTINAL ENDOSCOPY     gastric ulcer and refulx found    Prior to Admission medications   Medication Sig Start Date End Date Taking? Authorizing Provider  esomeprazole (NEXIUM) 40 MG capsule Take 1 capsule (40 mg total) by mouth daily. 06/01/21  Yes   estrogens, conjugated, (PREMARIN) 0.625 MG tablet Take 1 tablet (0.625 mg total) by mouth daily. 01/15/22  Yes   FLUoxetine (PROZAC) 40 MG capsule Take 1 capsule (40 mg total) by mouth every morning. 01/25/22  Yes   loratadine (CLARITIN) 10 MG  tablet Take 1 tablet by mouth daily at 6 (six) AM.   Yes [provider]  losartan-hydrochlorothiazide (HYZAAR) 100-25 MG tablet Take 1 tablet by mouth daily. 01/25/22  Yes   mirabegron ER (MYRBETRIQ) 25 MG TB24 tablet Take 1 tablet (25 mg total) by mouth daily. 08/17/21  Yes   simvastatin (ZOCOR) 20 MG tablet Take 1 tablet (20 mg total) by mouth daily. 06/01/21  Yes   ranitidine (ZANTAC) 150 MG tablet Take 150 mg by mouth as needed.  12/27/11  [provider]  sucralfate (CARAFATE) 1 GM/10ML suspension Take 10 mLs (1 g total) by mouth 4 (four) times daily. 12/07/11 12/27/11  Lafayette Dragon, MD    Current Outpatient Medications  Medication Sig Dispense Refill   esomeprazole (NEXIUM) 40 MG capsule Take 1 capsule (40 mg total) by mouth daily. 90 capsule 2   estrogens, conjugated, (PREMARIN) 0.625 MG tablet Take 1 tablet (0.625 mg total) by mouth daily. 90 tablet 1   FLUoxetine (PROZAC) 40 MG capsule Take 1 capsule (40 mg total) by mouth every morning. 90 capsule 2   loratadine (CLARITIN) 10 MG tablet Take 1 tablet by mouth daily at 6 (six) AM.     losartan-hydrochlorothiazide (HYZAAR) 100-25 MG tablet Take 1 tablet by mouth daily. 90 tablet 1   mirabegron ER (MYRBETRIQ) 25 MG TB24 tablet Take 1 tablet (25 mg total) by mouth daily. 90 tablet  3   simvastatin (ZOCOR) 20 MG tablet Take 1 tablet (20 mg total) by mouth daily. 90 tablet 2   Current Facility-Administered Medications  Medication Dose Route Frequency Provider Last Rate Last Admin   0.9 %  sodium chloride infusion  500 mL Intravenous Once Tiwanda Threats, Lajuan Lines, MD        Allergies as of 03/16/2022 - Review Complete 03/16/2022  Allergen Reaction Noted   Aspirin Shortness Of Breath and Anaphylaxis 09/25/2011   Gabapentin Other (See Comments), Swelling, and Anaphylaxis 12/07/2011   Ace inhibitors Cough 04/14/2012    Family History  Problem Relation Age of Onset   Heart attack Mother    Colon polyps Father 42   Heart disease  Father    Breast cancer Maternal Aunt        x 3   Breast cancer Paternal Aunt        x 3   Uterine cancer Maternal Grandmother    Lung cancer Maternal Grandfather    Colon polyps Paternal Grandmother 71   Colon cancer Paternal Grandmother 56   Esophageal cancer Neg Hx    Stomach cancer Neg Hx    Rectal cancer Neg Hx     Social History   Socioeconomic History   Marital status: Single    Spouse name: Not on file   Number of children: Not on file   Years of education: Not on file   Highest education level: Not on file  Occupational History   Not on file  Tobacco Use   Smoking status: Never   Smokeless tobacco: Never  Vaping Use   Vaping Use: Never used  Substance and Sexual Activity   Alcohol use: No   Drug use: No   Sexual activity: Not on file  Other Topics Concern   Not on file  Social History Narrative   Not on file   Social Determinants of Health   Financial Resource Strain: Not on file  Food Insecurity: Not on file  Transportation Needs: Not on file  Physical Activity: Not on file  Stress: Not on file  Social Connections: Not on file  Intimate Partner Violence: Not on file    Physical Exam: Vital signs in last 24 hours: '@BP'$  127/67   Pulse 62   Temp (!) 97.1 F (36.2 C)   Ht '5\' 7"'$  (1.702 m)   Wt 249 lb (112.9 kg)   LMP 10/16/2006   SpO2 97%   BMI 39.00 kg/m  GEN: NAD EYE: Sclerae anicteric ENT: MMM CV: Non-tachycardic Pulm: CTA b/l GI: Soft, NT/ND NEURO:  Alert & Oriented x 3   Zenovia Jarred, MD [facility]  03/16/2022 8:47 AM

## 2022-03-16 NOTE — Progress Notes (Signed)
Called to room to assist during endoscopic procedure.  Patient ID and intended procedure confirmed with present staff. Received instructions for my participation in the procedure from the performing physician.  

## 2022-03-16 NOTE — Progress Notes (Signed)
Pt's states no medical or surgical changes since previsit or office visit. 

## 2022-03-19 ENCOUNTER — Other Ambulatory Visit (HOSPITAL_COMMUNITY): Payer: Self-pay

## 2022-03-20 ENCOUNTER — Other Ambulatory Visit (HOSPITAL_COMMUNITY): Payer: Self-pay

## 2022-03-20 ENCOUNTER — Telehealth: Payer: Self-pay

## 2022-03-20 MED ORDER — SIMVASTATIN 20 MG PO TABS
20.0000 mg | ORAL_TABLET | Freq: Every day | ORAL | 1 refills | Status: DC
  Filled 2022-03-20: qty 90, 90d supply, fill #0
  Filled 2022-06-14: qty 90, 90d supply, fill #1

## 2022-03-20 MED ORDER — ESOMEPRAZOLE MAGNESIUM 40 MG PO CPDR
40.0000 mg | DELAYED_RELEASE_CAPSULE | Freq: Every day | ORAL | 1 refills | Status: DC
  Filled 2022-03-20: qty 90, 90d supply, fill #0
  Filled 2022-06-14: qty 90, 90d supply, fill #1

## 2022-03-20 NOTE — Telephone Encounter (Signed)
  Follow up Call-     03/16/2022    8:29 AM  Call back number  Post procedure Call Back phone  # [phone]  Permission to leave phone message Yes     Patient questions:  Do you have a fever, pain , or abdominal swelling? No. Pain Score  0 *  Have you tolerated food without any problems? Yes.    Have you been able to return to your normal activities? Yes.    Do you have any questions about your discharge instructions: Diet   No. Medications  No. Follow up visit  No.  Do you have questions or concerns about your Care? No.  Actions: * If pain score is 4 or above: No action needed, pain <4.

## 2022-03-21 ENCOUNTER — Other Ambulatory Visit (HOSPITAL_COMMUNITY): Payer: Self-pay

## 2022-03-23 ENCOUNTER — Encounter: Payer: Self-pay | Admitting: Internal Medicine

## 2022-04-10 ENCOUNTER — Other Ambulatory Visit: Payer: Self-pay | Admitting: Family Medicine

## 2022-04-10 DIAGNOSIS — R921 Mammographic calcification found on diagnostic imaging of breast: Secondary | ICD-10-CM

## 2022-04-16 ENCOUNTER — Other Ambulatory Visit (HOSPITAL_COMMUNITY): Payer: Self-pay

## 2022-05-01 ENCOUNTER — Other Ambulatory Visit (HOSPITAL_COMMUNITY): Payer: Self-pay

## 2022-05-02 DIAGNOSIS — F4324 Adjustment disorder with disturbance of conduct: Secondary | ICD-10-CM | POA: Diagnosis not present

## 2022-05-14 ENCOUNTER — Ambulatory Visit
Admission: RE | Admit: 2022-05-14 | Discharge: 2022-05-14 | Disposition: A | Discharge status: Home / Self Care | Payer: 59 | Source: Ambulatory Visit | Attending: Family Medicine | Admitting: Family Medicine

## 2022-05-14 DIAGNOSIS — R921 Mammographic calcification found on diagnostic imaging of breast: Secondary | ICD-10-CM | POA: Diagnosis not present

## 2022-06-05 ENCOUNTER — Other Ambulatory Visit (HOSPITAL_COMMUNITY): Payer: Self-pay

## 2022-06-05 MED ORDER — AMOXICILLIN 500 MG PO CAPS
500.0000 mg | ORAL_CAPSULE | Freq: Three times a day (TID) | ORAL | 0 refills | Status: AC
  Filled 2022-06-05: qty 21, 7d supply, fill #0

## 2022-06-14 ENCOUNTER — Other Ambulatory Visit (HOSPITAL_COMMUNITY): Payer: Self-pay

## 2022-06-26 DIAGNOSIS — E7800 Pure hypercholesterolemia, unspecified: Secondary | ICD-10-CM | POA: Diagnosis not present

## 2022-06-26 DIAGNOSIS — J309 Allergic rhinitis, unspecified: Secondary | ICD-10-CM | POA: Diagnosis not present

## 2022-06-26 DIAGNOSIS — Z23 Encounter for immunization: Secondary | ICD-10-CM | POA: Diagnosis not present

## 2022-06-26 DIAGNOSIS — K219 Gastro-esophageal reflux disease without esophagitis: Secondary | ICD-10-CM | POA: Diagnosis not present

## 2022-06-26 DIAGNOSIS — Z780 Asymptomatic menopausal state: Secondary | ICD-10-CM | POA: Diagnosis not present

## 2022-06-26 DIAGNOSIS — E669 Obesity, unspecified: Secondary | ICD-10-CM | POA: Diagnosis not present

## 2022-06-26 DIAGNOSIS — N3281 Overactive bladder: Secondary | ICD-10-CM | POA: Diagnosis not present

## 2022-06-26 DIAGNOSIS — F320 Major depressive disorder, single episode, mild: Secondary | ICD-10-CM | POA: Diagnosis not present

## 2022-06-26 DIAGNOSIS — I10 Essential (primary) hypertension: Secondary | ICD-10-CM | POA: Diagnosis not present

## 2022-07-16 ENCOUNTER — Other Ambulatory Visit (HOSPITAL_COMMUNITY): Payer: Self-pay

## 2022-07-17 ENCOUNTER — Other Ambulatory Visit (HOSPITAL_COMMUNITY): Payer: Self-pay

## 2022-07-17 MED ORDER — PREMARIN 0.625 MG PO TABS
0.6250 mg | ORAL_TABLET | Freq: Every day | ORAL | 1 refills | Status: DC
  Filled 2022-07-17: qty 90, 90d supply, fill #0
  Filled 2022-10-11: qty 30, 30d supply, fill #1
  Filled 2022-10-30: qty 90, 90d supply, fill #1

## 2022-07-26 ENCOUNTER — Other Ambulatory Visit (HOSPITAL_COMMUNITY): Payer: Self-pay

## 2022-07-26 MED ORDER — LOSARTAN POTASSIUM-HCTZ 100-25 MG PO TABS
1.0000 | ORAL_TABLET | Freq: Every day | ORAL | 1 refills | Status: DC
  Filled 2022-07-26: qty 90, 90d supply, fill #0
  Filled 2022-10-28: qty 90, 90d supply, fill #1

## 2022-08-28 ENCOUNTER — Other Ambulatory Visit (HOSPITAL_COMMUNITY): Payer: Self-pay

## 2022-08-28 DIAGNOSIS — Z01419 Encounter for gynecological examination (general) (routine) without abnormal findings: Secondary | ICD-10-CM | POA: Diagnosis not present

## 2022-08-28 MED ORDER — MYRBETRIQ 50 MG PO TB24
50.0000 mg | ORAL_TABLET | Freq: Every day | ORAL | 4 refills | Status: AC
  Filled 2022-08-28: qty 90, 90d supply, fill #0
  Filled 2022-11-22: qty 90, 90d supply, fill #1
  Filled 2023-02-18: qty 90, 90d supply, fill #2
  Filled 2023-05-27: qty 90, 90d supply, fill #3
  Filled 2023-08-22: qty 90, 90d supply, fill #4

## 2022-08-29 ENCOUNTER — Other Ambulatory Visit (HOSPITAL_COMMUNITY): Payer: Self-pay

## 2022-09-18 ENCOUNTER — Other Ambulatory Visit (HOSPITAL_COMMUNITY): Payer: Self-pay

## 2022-09-18 MED ORDER — ESOMEPRAZOLE MAGNESIUM 40 MG PO CPDR
40.0000 mg | DELAYED_RELEASE_CAPSULE | Freq: Every day | ORAL | 1 refills | Status: DC
  Filled 2022-09-18: qty 90, 90d supply, fill #0
  Filled 2022-12-10: qty 90, 90d supply, fill #1

## 2022-09-18 MED ORDER — SIMVASTATIN 20 MG PO TABS
20.0000 mg | ORAL_TABLET | Freq: Every day | ORAL | 1 refills | Status: DC
  Filled 2022-09-18: qty 90, 90d supply, fill #0
  Filled 2022-12-10: qty 90, 90d supply, fill #1

## 2022-10-01 ENCOUNTER — Other Ambulatory Visit (HOSPITAL_COMMUNITY): Payer: Self-pay

## 2022-10-01 DIAGNOSIS — M1711 Unilateral primary osteoarthritis, right knee: Secondary | ICD-10-CM | POA: Diagnosis not present

## 2022-10-01 DIAGNOSIS — M25561 Pain in right knee: Secondary | ICD-10-CM | POA: Diagnosis not present

## 2022-10-01 MED ORDER — MELOXICAM 15 MG PO TABS
15.0000 mg | ORAL_TABLET | Freq: Every day | ORAL | 1 refills | Status: DC
  Filled 2022-10-01 (×3): qty 30, 30d supply, fill #0
  Filled 2023-07-29: qty 30, 30d supply, fill #1

## 2022-10-09 ENCOUNTER — Other Ambulatory Visit (HOSPITAL_COMMUNITY): Payer: Self-pay

## 2022-10-09 ENCOUNTER — Other Ambulatory Visit: Payer: Self-pay | Admitting: Cardiovascular Disease

## 2022-10-09 MED ORDER — ALBUTEROL SULFATE HFA 108 (90 BASE) MCG/ACT IN AERS
2.0000 | INHALATION_SPRAY | Freq: Four times a day (QID) | RESPIRATORY_TRACT | 2 refills | Status: AC | PRN
  Filled 2022-10-09: qty 6.7, 25d supply, fill #0
  Filled 2022-11-06: qty 6.7, 25d supply, fill #1

## 2022-10-11 ENCOUNTER — Other Ambulatory Visit (HOSPITAL_COMMUNITY): Payer: Self-pay

## 2022-10-11 ENCOUNTER — Other Ambulatory Visit: Payer: Self-pay

## 2022-10-11 MED ORDER — BENZONATATE 100 MG PO CAPS
100.0000 mg | ORAL_CAPSULE | Freq: Three times a day (TID) | ORAL | 1 refills | Status: AC | PRN
  Filled 2022-10-11: qty 30, 10d supply, fill #0

## 2022-10-12 ENCOUNTER — Other Ambulatory Visit (HOSPITAL_COMMUNITY): Payer: Self-pay

## 2022-10-13 ENCOUNTER — Telehealth: Payer: 59 | Admitting: Physician Assistant

## 2022-10-13 DIAGNOSIS — J208 Acute bronchitis due to other specified organisms: Secondary | ICD-10-CM

## 2022-10-13 DIAGNOSIS — B9689 Other specified bacterial agents as the cause of diseases classified elsewhere: Secondary | ICD-10-CM | POA: Diagnosis not present

## 2022-10-13 MED ORDER — PROMETHAZINE-DM 6.25-15 MG/5ML PO SYRP: 5.0000 mL | ORAL_SOLUTION | Freq: Four times a day (QID) | ORAL | 0 refills | Status: AC | PRN

## 2022-10-13 MED ORDER — PREDNISONE 10 MG (21) PO TBPK: ORAL_TABLET | ORAL | 0 refills | Status: AC

## 2022-10-13 MED ORDER — AZITHROMYCIN 250 MG PO TABS: ORAL_TABLET | ORAL | 0 refills | Status: AC

## 2022-10-13 NOTE — Progress Notes (Signed)
We are sorry that you are not feeling well.  Here is how we plan to help!  Based on your presentation I believe you most likely have A cough due to bacteria.  When patients have a fever and a productive cough with a change in color or increased sputum production, we are concerned about bacterial bronchitis.  If left untreated it can progress to pneumonia.  If your symptoms do not improve with your treatment plan it is important that you contact your provider.   I have prescribed Azithromyin 250 mg: two tablets now and then one tablet daily for 4 additonal days    In addition you may use Promethazine DM cough syrup Take 73m every 6 hours as needed for cough. Can be used with Tessalon perles.   Prednisone 10 mg daily for 6 days (see taper instructions below)  Directions for 6 day taper: Day 1: 2 tablets before breakfast, 1 after both lunch & dinner and 2 at bedtime Day 2: 1 tab before breakfast, 1 after both lunch & dinner and 2 at bedtime Day 3: 1 tab at each meal & 1 at bedtime Day 4: 1 tab at breakfast, 1 at lunch, 1 at bedtime Day 5: 1 tab at breakfast & 1 tab at bedtime Day 6: 1 tab at breakfast  From your responses in the eVisit questionnaire you describe inflammation in the upper respiratory tract which is causing a significant cough.  This is commonly called Bronchitis and has four common causes:   Allergies Viral Infections Acid Reflux Bacterial Infection Allergies, viruses and acid reflux are treated by controlling symptoms or eliminating the cause. An example might be a cough caused by taking certain blood pressure medications. You stop the cough by changing the medication. Another example might be a cough caused by acid reflux. Controlling the reflux helps control the cough.  USE OF BRONCHODILATOR ("RESCUE") INHALERS: There is a risk from using your bronchodilator too frequently.  The risk is that over-reliance on a medication which only relaxes the muscles surrounding the breathing  tubes can reduce the effectiveness of medications prescribed to reduce swelling and congestion of the tubes themselves.  Although you feel brief relief from the bronchodilator inhaler, your asthma may actually be worsening with the tubes becoming more swollen and filled with mucus.  This can delay other crucial treatments, such as oral steroid medications. If you need to use a bronchodilator inhaler daily, several times per day, you should discuss this with your provider.  There are probably better treatments that could be used to keep your asthma under control.     HOME CARE Only take medications as instructed by your medical team. Complete the entire course of an antibiotic. Drink plenty of fluids and get plenty of rest. Avoid close contacts especially the very young and the elderly Cover your mouth if you cough or cough into your sleeve. Always remember to wash your hands A steam or ultrasonic humidifier can help congestion.   GET HELP RIGHT AWAY IF: You develop worsening fever. You become short of breath You cough up blood. Your symptoms persist after you have completed your treatment plan MAKE SURE YOU  Understand these instructions. Will watch your condition. Will get help right away if you are not doing well or get worse.    Thank you for choosing an e-visit.  Your e-visit answers were reviewed by a board certified advanced clinical practitioner to complete your personal care plan. Depending upon the condition, your plan could have  included both over the counter or prescription medications.  Please review your pharmacy choice. Make sure the pharmacy is open so you can pick up prescription now. If there is a problem, you may contact your provider through [employer] and have the prescription routed to another pharmacy.  Your safety is important to [place]. If you have drug allergies check your prescription carefully.   For the next 24 hours you can use MyChart to ask questions  about today's visit, request a non-urgent call back, or ask for a work or school excuse. You will get an email in the next two days asking about your experience. I hope that your e-visit has been valuable and will speed your recovery.  I have spent 5 minutes in review of e-visit questionnaire, review and updating patient chart, medical decision making and response to patient.   Mar Daring, PA-C

## 2022-10-28 ENCOUNTER — Other Ambulatory Visit (HOSPITAL_COMMUNITY): Payer: Self-pay

## 2022-10-29 ENCOUNTER — Other Ambulatory Visit (HOSPITAL_COMMUNITY): Payer: Self-pay

## 2022-10-29 ENCOUNTER — Other Ambulatory Visit: Payer: Self-pay

## 2022-10-29 DIAGNOSIS — R059 Cough, unspecified: Secondary | ICD-10-CM | POA: Diagnosis not present

## 2022-10-29 MED ORDER — HYDROCODONE BIT-HOMATROP MBR 5-1.5 MG/5ML PO SOLN: 5.0000 mL | Freq: Four times a day (QID) | ORAL | 0 refills | Status: DC | PRN

## 2022-10-30 ENCOUNTER — Other Ambulatory Visit (HOSPITAL_COMMUNITY): Payer: Self-pay

## 2022-10-30 IMAGING — MG MM DIGITAL SCREENING BILAT W/ TOMO AND CAD
6 of 10 series · 6 of 30 positions shown · non-contrast
Comparison: Previous exam(s).
COMPARISON: Previous exam(s).

Addendum:
CLINICAL DATA: Screening.

EXAM:
DIGITAL SCREENING BILATERAL MAMMOGRAM WITH TOMOSYNTHESIS AND CAD
TECHNIQUE: Bilateral screening digital craniocaudal and mediolateral oblique
mammograms were obtained. Bilateral screening digital breast
tomosynthesis was performed. The images were evaluated with
computer-aided detection.

[R CC synth-2D]
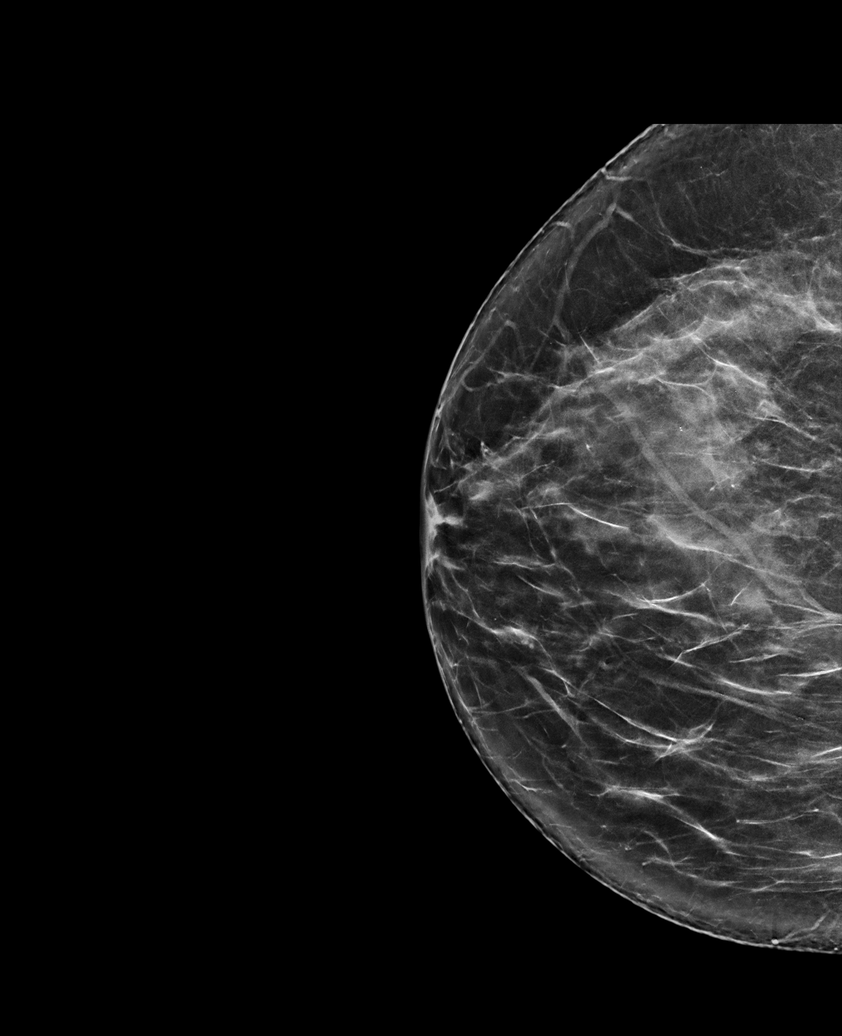

[L CC synth-2D (1 of 2)]
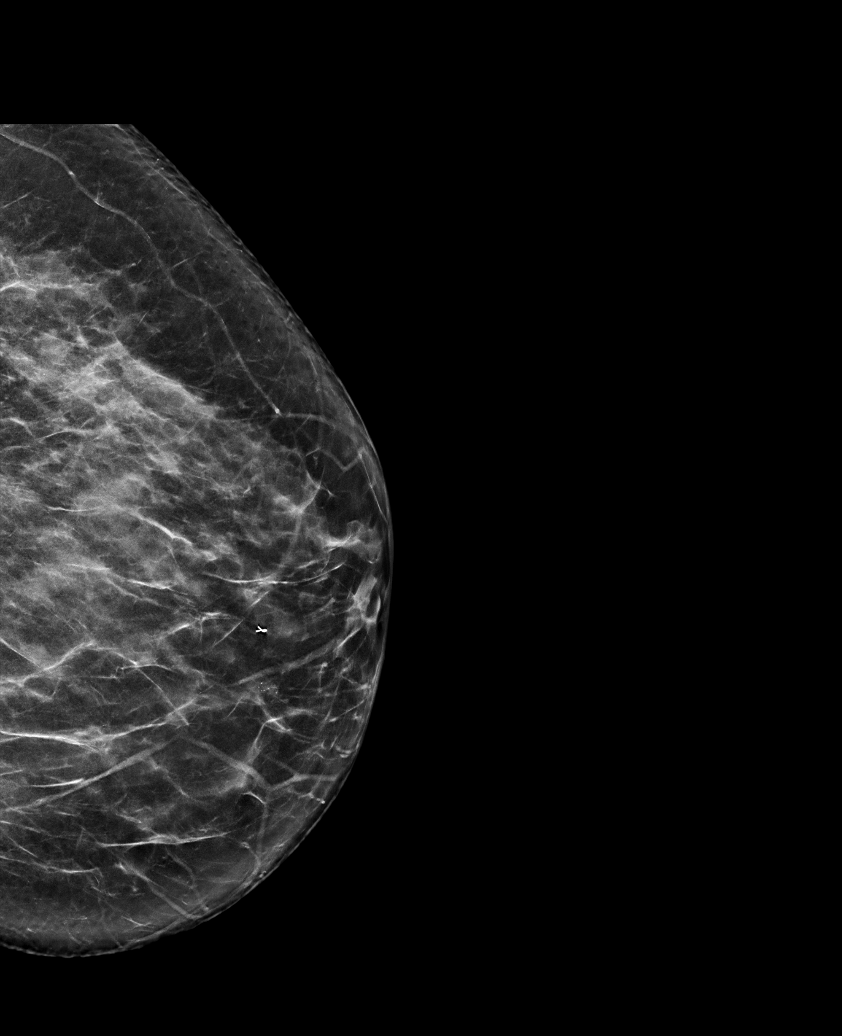

[L CC synth-2D (2 of 2)]
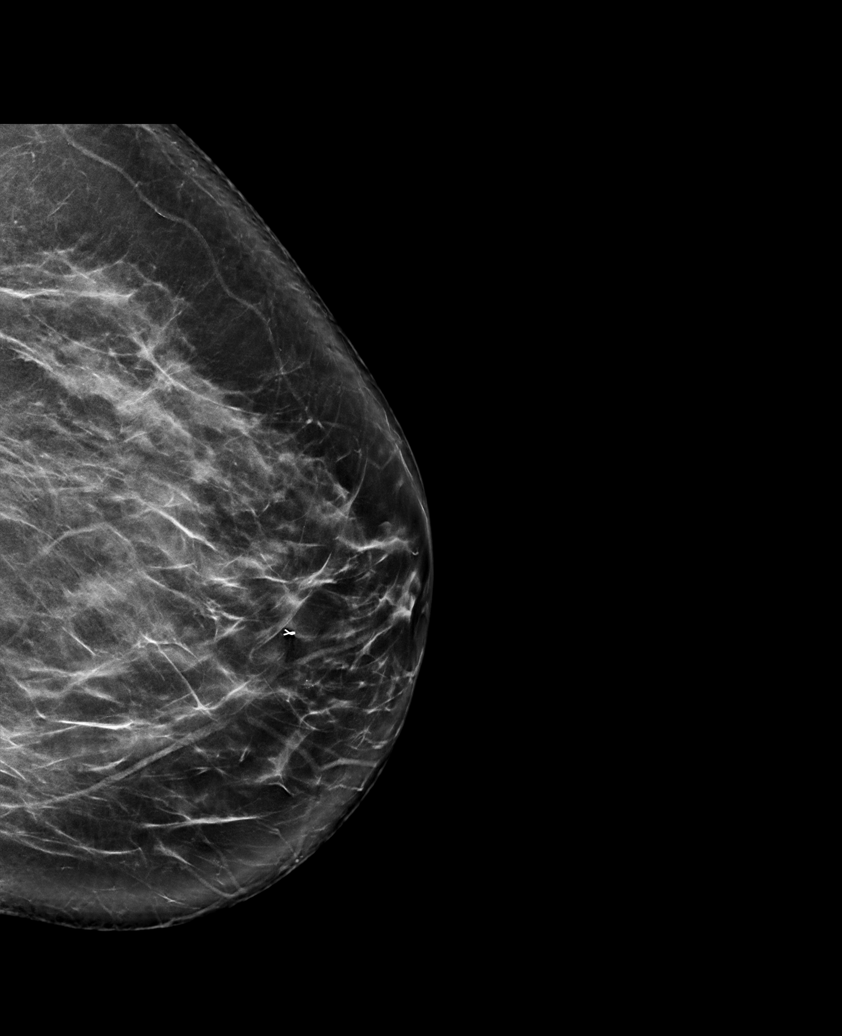

[R MLO synth-2D]
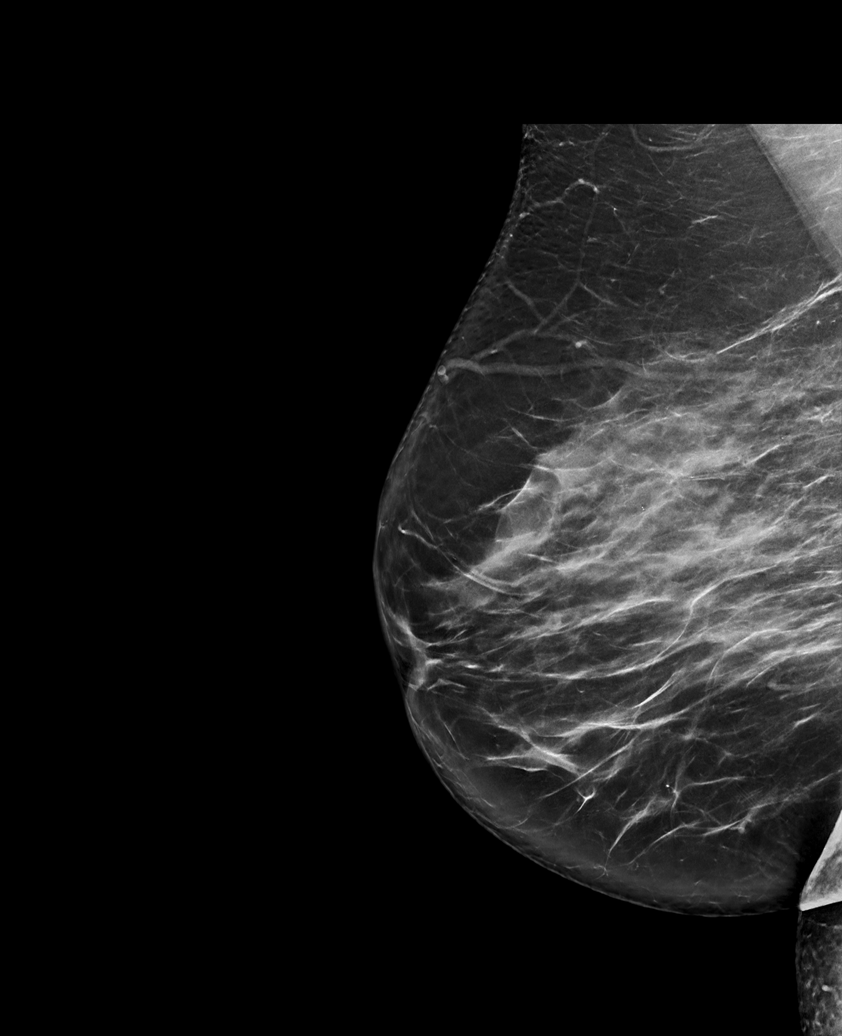

[L MLO synth-2D]
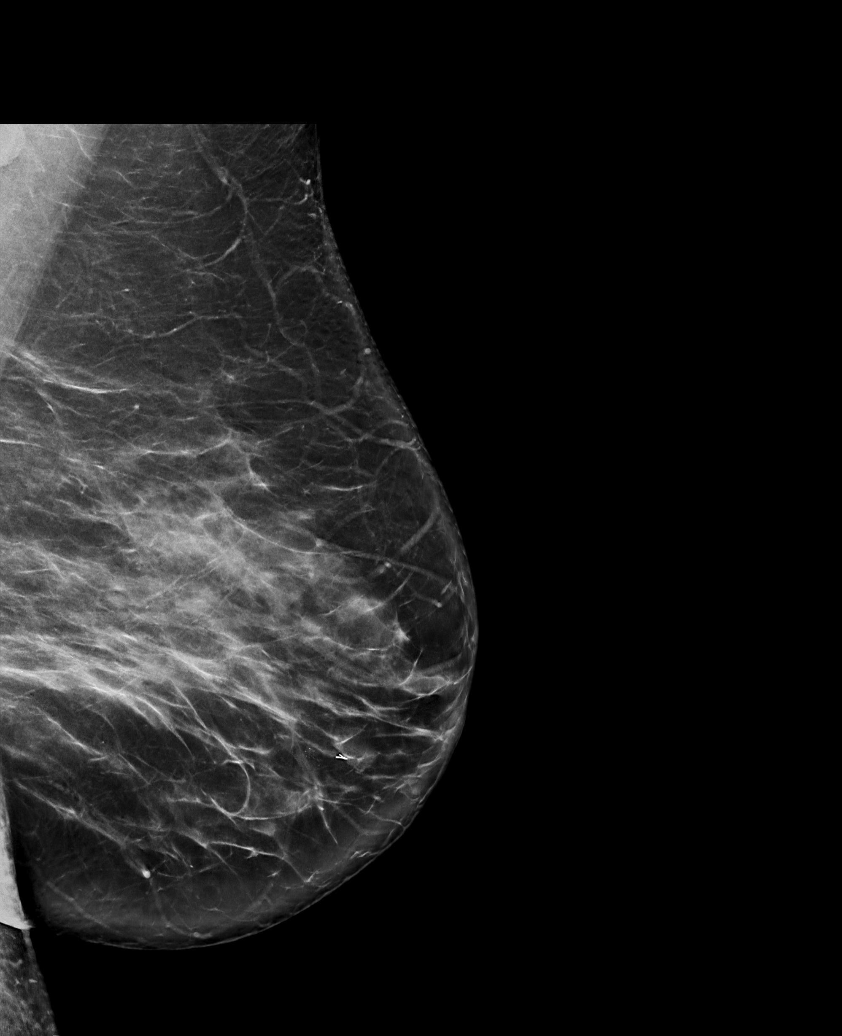

[L MLO tomo · tomo slice 45/88.0]
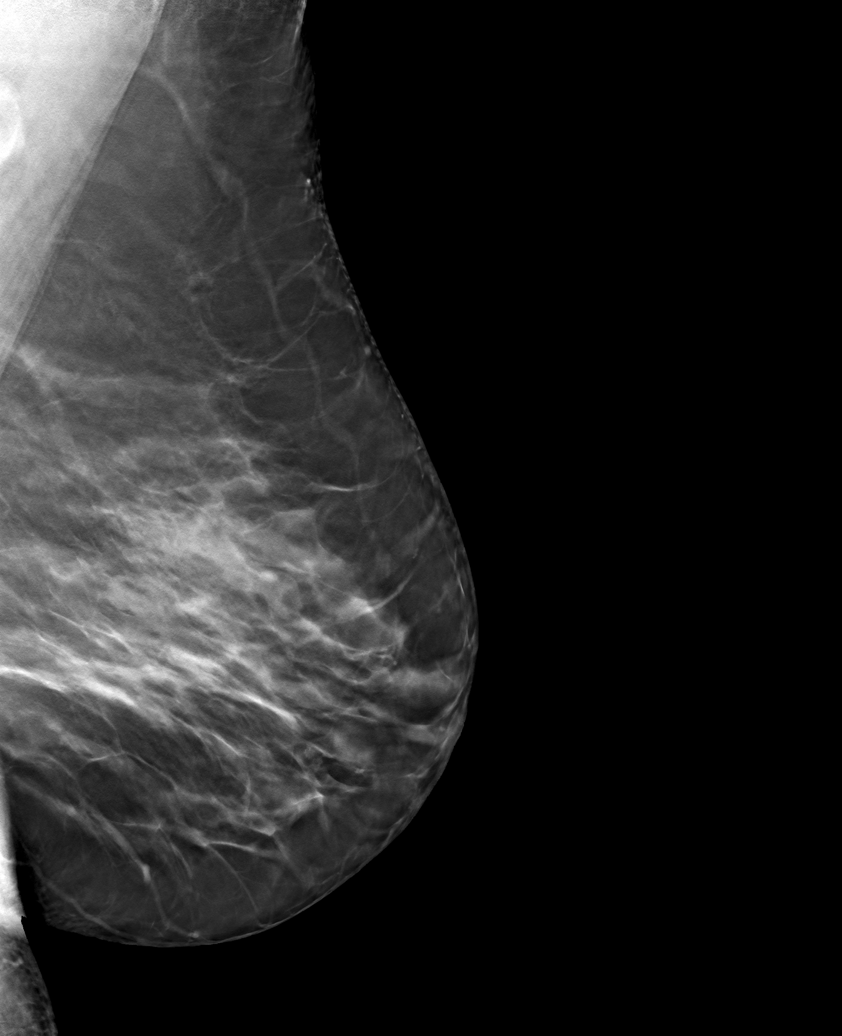

[6 of 30 positions shown; findings below may reference images not displayed]

ACR Breast Density Category c: The breast tissue is heterogeneously
dense, which may obscure small masses.
FINDINGS: In the right breast, calcifications warrant further evaluation with
magnified views. In the left breast, no findings suspicious for
malignancy.
IMPRESSION: Further evaluation is suggested for calcifications in the right
breast.

RECOMMENDATION:
Diagnostic mammogram of the right breast. (Code:VN-I-LLX)

The patient will be contacted regarding the findings, and additional
imaging will be scheduled.

BI-RADS CATEGORY  0: Incomplete. Need additional imaging evaluation
and/or prior mammograms for comparison.

ADDENDUM:
This is an addendum to a screening mammogram performed on
05/12/2021. Screening report should state that calcifications in the
left breast warrant further evaluation. No suspicious findings seen
in the right breast.

*** End of Addendum ***
ACR Breast Density Category c: The breast tissue is heterogeneously
dense, which may obscure small masses.
FINDINGS: In the right breast, calcifications warrant further evaluation with
magnified views. In the left breast, no findings suspicious for
malignancy.
IMPRESSION: Further evaluation is suggested for calcifications in the right
breast.

RECOMMENDATION:
Diagnostic mammogram of the right breast. (Code:VN-I-LLX)

The patient will be contacted regarding the findings, and additional
imaging will be scheduled.

BI-RADS CATEGORY  0: Incomplete. Need additional imaging evaluation
and/or prior mammograms for comparison.

## 2022-10-30 MED ORDER — HYDROCODONE BIT-HOMATROP MBR 5-1.5 MG/5ML PO SOLN
5.0000 mL | Freq: Four times a day (QID) | ORAL | 0 refills | Status: AC | PRN
  Filled 2022-10-30: qty 120, 6d supply, fill #0

## 2022-10-31 ENCOUNTER — Other Ambulatory Visit (HOSPITAL_COMMUNITY): Payer: Self-pay

## 2022-10-31 DIAGNOSIS — F4324 Adjustment disorder with disturbance of conduct: Secondary | ICD-10-CM | POA: Diagnosis not present

## 2022-11-01 ENCOUNTER — Other Ambulatory Visit (HOSPITAL_COMMUNITY): Payer: Self-pay

## 2022-11-01 MED ORDER — FLUOXETINE HCL 40 MG PO CAPS
40.0000 mg | ORAL_CAPSULE | Freq: Every morning | ORAL | 1 refills | Status: DC
  Filled 2022-11-01: qty 90, 90d supply, fill #0
  Filled 2023-01-21: qty 90, 90d supply, fill #1

## 2022-11-02 ENCOUNTER — Other Ambulatory Visit (HOSPITAL_COMMUNITY): Payer: Self-pay

## 2022-11-06 ENCOUNTER — Other Ambulatory Visit (HOSPITAL_COMMUNITY): Payer: Self-pay

## 2022-11-06 MED ORDER — PREDNISONE 20 MG PO TABS
ORAL_TABLET | ORAL | 0 refills | Status: AC
  Filled 2022-11-06: qty 18, 9d supply, fill #0

## 2022-11-22 ENCOUNTER — Other Ambulatory Visit (HOSPITAL_COMMUNITY): Payer: Self-pay

## 2022-11-22 ENCOUNTER — Other Ambulatory Visit: Payer: Self-pay | Admitting: Family Medicine

## 2022-11-22 DIAGNOSIS — R921 Mammographic calcification found on diagnostic imaging of breast: Secondary | ICD-10-CM

## 2022-12-14 ENCOUNTER — Other Ambulatory Visit (HOSPITAL_COMMUNITY): Payer: Self-pay

## 2022-12-14 DIAGNOSIS — M25561 Pain in right knee: Secondary | ICD-10-CM | POA: Diagnosis not present

## 2022-12-14 MED ORDER — PREDNISONE 10 MG PO TABS
ORAL_TABLET | ORAL | 0 refills | Status: AC
  Filled 2022-12-14: qty 21, 12d supply, fill #0

## 2022-12-25 DIAGNOSIS — J309 Allergic rhinitis, unspecified: Secondary | ICD-10-CM | POA: Diagnosis not present

## 2022-12-25 DIAGNOSIS — Z0000 Encounter for general adult medical examination without abnormal findings: Secondary | ICD-10-CM | POA: Diagnosis not present

## 2022-12-25 DIAGNOSIS — F320 Major depressive disorder, single episode, mild: Secondary | ICD-10-CM | POA: Diagnosis not present

## 2022-12-25 DIAGNOSIS — K219 Gastro-esophageal reflux disease without esophagitis: Secondary | ICD-10-CM | POA: Diagnosis not present

## 2022-12-25 DIAGNOSIS — E669 Obesity, unspecified: Secondary | ICD-10-CM | POA: Diagnosis not present

## 2022-12-25 DIAGNOSIS — E7800 Pure hypercholesterolemia, unspecified: Secondary | ICD-10-CM | POA: Diagnosis not present

## 2022-12-25 DIAGNOSIS — Z780 Asymptomatic menopausal state: Secondary | ICD-10-CM | POA: Diagnosis not present

## 2022-12-25 DIAGNOSIS — I10 Essential (primary) hypertension: Secondary | ICD-10-CM | POA: Diagnosis not present

## 2022-12-25 DIAGNOSIS — N3281 Overactive bladder: Secondary | ICD-10-CM | POA: Diagnosis not present

## 2023-01-21 ENCOUNTER — Other Ambulatory Visit (HOSPITAL_COMMUNITY): Payer: Self-pay

## 2023-01-21 MED ORDER — SIMVASTATIN 20 MG PO TABS
20.0000 mg | ORAL_TABLET | Freq: Every day | ORAL | 1 refills | Status: DC
  Filled 2023-03-11: qty 90, 90d supply, fill #0
  Filled 2023-06-13: qty 90, 90d supply, fill #1

## 2023-01-21 MED ORDER — LOSARTAN POTASSIUM-HCTZ 100-25 MG PO TABS
1.0000 | ORAL_TABLET | Freq: Every day | ORAL | 0 refills | Status: DC
  Filled 2023-01-21: qty 90, 90d supply, fill #0

## 2023-01-22 ENCOUNTER — Other Ambulatory Visit: Payer: Self-pay

## 2023-01-24 ENCOUNTER — Other Ambulatory Visit (HOSPITAL_COMMUNITY): Payer: Self-pay

## 2023-01-25 ENCOUNTER — Other Ambulatory Visit (HOSPITAL_COMMUNITY): Payer: Self-pay

## 2023-01-25 MED ORDER — PREMARIN 0.625 MG PO TABS
0.6250 mg | ORAL_TABLET | Freq: Every day | ORAL | 1 refills | Status: DC
  Filled 2023-01-25: qty 90, 90d supply, fill #0
  Filled 2023-04-24: qty 90, 90d supply, fill #1

## 2023-02-19 ENCOUNTER — Other Ambulatory Visit (HOSPITAL_COMMUNITY): Payer: Self-pay

## 2023-03-01 ENCOUNTER — Other Ambulatory Visit: Payer: Self-pay

## 2023-03-05 ENCOUNTER — Other Ambulatory Visit (HOSPITAL_COMMUNITY): Payer: Self-pay

## 2023-03-11 ENCOUNTER — Other Ambulatory Visit (HOSPITAL_COMMUNITY): Payer: Self-pay

## 2023-03-12 ENCOUNTER — Other Ambulatory Visit: Payer: Self-pay

## 2023-03-12 ENCOUNTER — Other Ambulatory Visit (HOSPITAL_COMMUNITY): Payer: Self-pay

## 2023-03-12 MED ORDER — ESOMEPRAZOLE MAGNESIUM 40 MG PO CPDR
40.0000 mg | DELAYED_RELEASE_CAPSULE | Freq: Every day | ORAL | 1 refills | Status: DC
  Filled 2023-03-12: qty 90, 90d supply, fill #0
  Filled 2023-06-06: qty 90, 90d supply, fill #1

## 2023-04-17 DIAGNOSIS — F4324 Adjustment disorder with disturbance of conduct: Secondary | ICD-10-CM | POA: Diagnosis not present

## 2023-04-21 ENCOUNTER — Other Ambulatory Visit (HOSPITAL_COMMUNITY): Payer: Self-pay

## 2023-04-22 ENCOUNTER — Other Ambulatory Visit (HOSPITAL_COMMUNITY): Payer: Self-pay

## 2023-04-22 MED ORDER — LOSARTAN POTASSIUM-HCTZ 100-25 MG PO TABS
1.0000 | ORAL_TABLET | Freq: Every day | ORAL | 0 refills | Status: DC
  Filled 2023-04-22: qty 46, 46d supply, fill #0
  Filled 2023-04-22: qty 44, 44d supply, fill #0

## 2023-04-24 ENCOUNTER — Other Ambulatory Visit: Payer: Self-pay

## 2023-04-24 ENCOUNTER — Other Ambulatory Visit (HOSPITAL_COMMUNITY): Payer: Self-pay

## 2023-04-26 ENCOUNTER — Other Ambulatory Visit (HOSPITAL_COMMUNITY): Payer: Self-pay

## 2023-04-26 MED ORDER — FLUOXETINE HCL 40 MG PO CAPS
40.0000 mg | ORAL_CAPSULE | Freq: Every morning | ORAL | 2 refills | Status: AC
  Filled 2023-04-26: qty 90, 90d supply, fill #0
  Filled 2023-07-26: qty 90, 90d supply, fill #1
  Filled 2023-11-27 – 2024-04-19 (×2): qty 90, 90d supply, fill #2

## 2023-05-28 ENCOUNTER — Other Ambulatory Visit (HOSPITAL_COMMUNITY): Payer: Self-pay

## 2023-06-06 ENCOUNTER — Other Ambulatory Visit: Payer: Self-pay

## 2023-06-06 ENCOUNTER — Ambulatory Visit
Admission: RE | Admit: 2023-06-06 | Discharge: 2023-06-06 | Disposition: A | Discharge status: Home / Self Care | Payer: 59 | Source: Ambulatory Visit | Attending: Family Medicine | Admitting: Family Medicine

## 2023-06-06 DIAGNOSIS — R921 Mammographic calcification found on diagnostic imaging of breast: Secondary | ICD-10-CM

## 2023-06-26 ENCOUNTER — Other Ambulatory Visit (HOSPITAL_COMMUNITY): Payer: Self-pay

## 2023-06-26 DIAGNOSIS — N3281 Overactive bladder: Secondary | ICD-10-CM | POA: Diagnosis not present

## 2023-06-26 DIAGNOSIS — F320 Major depressive disorder, single episode, mild: Secondary | ICD-10-CM | POA: Diagnosis not present

## 2023-06-26 DIAGNOSIS — J309 Allergic rhinitis, unspecified: Secondary | ICD-10-CM | POA: Diagnosis not present

## 2023-06-26 DIAGNOSIS — T148XXA Other injury of unspecified body region, initial encounter: Secondary | ICD-10-CM | POA: Diagnosis not present

## 2023-06-26 DIAGNOSIS — E7800 Pure hypercholesterolemia, unspecified: Secondary | ICD-10-CM | POA: Diagnosis not present

## 2023-06-26 DIAGNOSIS — E669 Obesity, unspecified: Secondary | ICD-10-CM | POA: Diagnosis not present

## 2023-06-26 DIAGNOSIS — K219 Gastro-esophageal reflux disease without esophagitis: Secondary | ICD-10-CM | POA: Diagnosis not present

## 2023-06-26 DIAGNOSIS — Z780 Asymptomatic menopausal state: Secondary | ICD-10-CM | POA: Diagnosis not present

## 2023-06-26 DIAGNOSIS — I10 Essential (primary) hypertension: Secondary | ICD-10-CM | POA: Diagnosis not present

## 2023-06-27 ENCOUNTER — Other Ambulatory Visit (HOSPITAL_COMMUNITY): Payer: Self-pay

## 2023-06-27 MED ORDER — LOSARTAN POTASSIUM-HCTZ 100-12.5 MG PO TABS
1.0000 | ORAL_TABLET | Freq: Every day | ORAL | 3 refills | Status: AC
  Filled 2023-06-27: qty 90, 90d supply, fill #0

## 2023-07-04 ENCOUNTER — Other Ambulatory Visit (HOSPITAL_COMMUNITY): Payer: Self-pay

## 2023-07-04 MED ORDER — HYDROCHLOROTHIAZIDE 12.5 MG PO TABS
12.5000 mg | ORAL_TABLET | Freq: Every day | ORAL | 3 refills | Status: AC
  Filled 2023-07-04: qty 90, 90d supply, fill #0
  Filled 2023-10-07: qty 90, 90d supply, fill #1

## 2023-07-04 MED ORDER — POTASSIUM CHLORIDE CRYS ER 10 MEQ PO TBCR
10.0000 meq | EXTENDED_RELEASE_TABLET | Freq: Every day | ORAL | 3 refills | Status: AC
  Filled 2023-07-04: qty 90, 90d supply, fill #0
  Filled 2024-01-02: qty 90, 90d supply, fill #1
  Filled 2024-04-19: qty 90, 90d supply, fill #2

## 2023-07-15 DIAGNOSIS — L720 Epidermal cyst: Secondary | ICD-10-CM | POA: Diagnosis not present

## 2023-07-15 DIAGNOSIS — L918 Other hypertrophic disorders of the skin: Secondary | ICD-10-CM | POA: Diagnosis not present

## 2023-07-15 DIAGNOSIS — D485 Neoplasm of uncertain behavior of skin: Secondary | ICD-10-CM | POA: Diagnosis not present

## 2023-07-26 ENCOUNTER — Other Ambulatory Visit: Payer: Self-pay

## 2023-07-26 ENCOUNTER — Other Ambulatory Visit (HOSPITAL_COMMUNITY): Payer: Self-pay

## 2023-07-29 ENCOUNTER — Other Ambulatory Visit (HOSPITAL_COMMUNITY): Payer: Self-pay

## 2023-07-29 MED ORDER — PREMARIN 0.625 MG PO TABS
0.6250 mg | ORAL_TABLET | Freq: Every day | ORAL | 1 refills | Status: DC
  Filled 2023-07-29 (×2): qty 90, 90d supply, fill #0
  Filled 2023-10-22: qty 90, 90d supply, fill #1

## 2023-08-09 ENCOUNTER — Other Ambulatory Visit (HOSPITAL_COMMUNITY): Payer: Self-pay

## 2023-08-09 DIAGNOSIS — M25561 Pain in right knee: Secondary | ICD-10-CM | POA: Diagnosis not present

## 2023-08-09 DIAGNOSIS — M79604 Pain in right leg: Secondary | ICD-10-CM | POA: Diagnosis not present

## 2023-08-09 MED ORDER — MELOXICAM 15 MG PO TABS
15.0000 mg | ORAL_TABLET | Freq: Every day | ORAL | 1 refills | Status: AC
Start: 2023-08-09 — End: ?
  Filled 2023-08-09 – 2023-08-23 (×2): qty 30, 30d supply, fill #0

## 2023-08-22 ENCOUNTER — Other Ambulatory Visit (HOSPITAL_COMMUNITY): Payer: Self-pay

## 2023-08-22 ENCOUNTER — Other Ambulatory Visit: Payer: Self-pay

## 2023-08-23 ENCOUNTER — Other Ambulatory Visit (HOSPITAL_COMMUNITY): Payer: Self-pay

## 2023-08-23 MED ORDER — LOSARTAN POTASSIUM-HCTZ 100-12.5 MG PO TABS
1.0000 | ORAL_TABLET | Freq: Every day | ORAL | 0 refills | Status: AC
  Filled 2023-08-23 – 2023-10-07 (×2): qty 90, 90d supply, fill #0

## 2023-09-02 ENCOUNTER — Other Ambulatory Visit (HOSPITAL_COMMUNITY): Payer: Self-pay

## 2023-09-03 ENCOUNTER — Other Ambulatory Visit (HOSPITAL_COMMUNITY): Payer: Self-pay

## 2023-09-03 MED ORDER — ESOMEPRAZOLE MAGNESIUM 40 MG PO CPDR
40.0000 mg | DELAYED_RELEASE_CAPSULE | Freq: Every day | ORAL | 1 refills | Status: DC
  Filled 2023-09-03: qty 90, 90d supply, fill #0
  Filled 2023-12-12: qty 90, 90d supply, fill #1

## 2023-09-09 ENCOUNTER — Other Ambulatory Visit (HOSPITAL_COMMUNITY): Payer: Self-pay

## 2023-09-09 MED ORDER — SIMVASTATIN 20 MG PO TABS
20.0000 mg | ORAL_TABLET | Freq: Every evening | ORAL | 0 refills | Status: DC
  Filled 2023-09-09: qty 90, 90d supply, fill #0

## 2023-10-07 ENCOUNTER — Other Ambulatory Visit (HOSPITAL_COMMUNITY): Payer: Self-pay

## 2023-10-09 ENCOUNTER — Other Ambulatory Visit (HOSPITAL_COMMUNITY): Payer: Self-pay

## 2023-10-09 DIAGNOSIS — F4324 Adjustment disorder with disturbance of conduct: Secondary | ICD-10-CM | POA: Diagnosis not present

## 2023-10-09 MED ORDER — FLUOXETINE HCL 40 MG PO CAPS
40.0000 mg | ORAL_CAPSULE | Freq: Two times a day (BID) | ORAL | 1 refills | Status: AC
  Filled 2023-10-09: qty 180, 90d supply, fill #0
  Filled 2024-01-02: qty 180, 90d supply, fill #1

## 2023-10-09 MED ORDER — TRAZODONE HCL 50 MG PO TABS
50.0000 mg | ORAL_TABLET | Freq: Every evening | ORAL | 1 refills | Status: AC
  Filled 2023-10-09: qty 60, 30d supply, fill #0
  Filled 2023-11-27: qty 60, 30d supply, fill #1

## 2023-10-22 ENCOUNTER — Other Ambulatory Visit (HOSPITAL_COMMUNITY): Payer: Self-pay

## 2023-10-22 ENCOUNTER — Other Ambulatory Visit: Payer: Self-pay

## 2023-11-06 DIAGNOSIS — F4324 Adjustment disorder with disturbance of conduct: Secondary | ICD-10-CM | POA: Diagnosis not present

## 2023-11-27 ENCOUNTER — Encounter (HOSPITAL_COMMUNITY): Payer: Self-pay

## 2023-11-27 ENCOUNTER — Other Ambulatory Visit (HOSPITAL_COMMUNITY): Payer: Self-pay

## 2023-11-27 ENCOUNTER — Other Ambulatory Visit: Payer: Self-pay

## 2023-11-27 DIAGNOSIS — Z01419 Encounter for gynecological examination (general) (routine) without abnormal findings: Secondary | ICD-10-CM | POA: Diagnosis not present

## 2023-11-27 MED ORDER — MIRABEGRON ER 50 MG PO TB24
50.0000 mg | ORAL_TABLET | Freq: Every day | ORAL | 4 refills | Status: AC
  Filled 2023-11-27: qty 30, 30d supply, fill #0
  Filled 2023-12-24: qty 30, 30d supply, fill #1
  Filled 2024-02-05: qty 30, 30d supply, fill #2
  Filled 2024-02-06: qty 90, 90d supply, fill #2
  Filled 2024-05-04: qty 90, 90d supply, fill #3
  Filled 2024-07-31: qty 90, 90d supply, fill #4
  Filled 2024-10-29: qty 30, 30d supply, fill #5

## 2023-12-10 DIAGNOSIS — F4324 Adjustment disorder with disturbance of conduct: Secondary | ICD-10-CM | POA: Diagnosis not present

## 2023-12-12 ENCOUNTER — Other Ambulatory Visit: Payer: Self-pay

## 2023-12-12 ENCOUNTER — Other Ambulatory Visit (HOSPITAL_COMMUNITY): Payer: Self-pay

## 2023-12-12 DIAGNOSIS — B078 Other viral warts: Secondary | ICD-10-CM | POA: Diagnosis not present

## 2023-12-12 DIAGNOSIS — L820 Inflamed seborrheic keratosis: Secondary | ICD-10-CM | POA: Diagnosis not present

## 2023-12-12 DIAGNOSIS — D485 Neoplasm of uncertain behavior of skin: Secondary | ICD-10-CM | POA: Diagnosis not present

## 2023-12-12 DIAGNOSIS — D2361 Other benign neoplasm of skin of right upper limb, including shoulder: Secondary | ICD-10-CM | POA: Diagnosis not present

## 2023-12-13 ENCOUNTER — Other Ambulatory Visit (HOSPITAL_COMMUNITY): Payer: Self-pay

## 2023-12-16 ENCOUNTER — Other Ambulatory Visit (HOSPITAL_COMMUNITY): Payer: Self-pay

## 2023-12-16 MED ORDER — SIMVASTATIN 20 MG PO TABS
20.0000 mg | ORAL_TABLET | Freq: Every evening | ORAL | 0 refills | Status: AC
  Filled 2023-12-16: qty 90, 90d supply, fill #0

## 2023-12-23 DIAGNOSIS — Z1159 Encounter for screening for other viral diseases: Secondary | ICD-10-CM | POA: Diagnosis not present

## 2023-12-23 DIAGNOSIS — E7800 Pure hypercholesterolemia, unspecified: Secondary | ICD-10-CM | POA: Diagnosis not present

## 2023-12-23 DIAGNOSIS — I10 Essential (primary) hypertension: Secondary | ICD-10-CM | POA: Diagnosis not present

## 2023-12-23 DIAGNOSIS — E669 Obesity, unspecified: Secondary | ICD-10-CM | POA: Diagnosis not present

## 2023-12-24 ENCOUNTER — Other Ambulatory Visit (HOSPITAL_COMMUNITY): Payer: Self-pay

## 2023-12-27 ENCOUNTER — Other Ambulatory Visit: Payer: Self-pay

## 2023-12-27 ENCOUNTER — Other Ambulatory Visit (HOSPITAL_COMMUNITY): Payer: Self-pay

## 2023-12-27 DIAGNOSIS — I10 Essential (primary) hypertension: Secondary | ICD-10-CM | POA: Diagnosis not present

## 2023-12-27 DIAGNOSIS — E669 Obesity, unspecified: Secondary | ICD-10-CM | POA: Diagnosis not present

## 2023-12-27 DIAGNOSIS — J45909 Unspecified asthma, uncomplicated: Secondary | ICD-10-CM | POA: Diagnosis not present

## 2023-12-27 DIAGNOSIS — E7800 Pure hypercholesterolemia, unspecified: Secondary | ICD-10-CM | POA: Diagnosis not present

## 2023-12-27 DIAGNOSIS — J309 Allergic rhinitis, unspecified: Secondary | ICD-10-CM | POA: Diagnosis not present

## 2023-12-27 DIAGNOSIS — N3281 Overactive bladder: Secondary | ICD-10-CM | POA: Diagnosis not present

## 2023-12-27 DIAGNOSIS — F320 Major depressive disorder, single episode, mild: Secondary | ICD-10-CM | POA: Diagnosis not present

## 2023-12-27 DIAGNOSIS — R7303 Prediabetes: Secondary | ICD-10-CM | POA: Diagnosis not present

## 2023-12-27 DIAGNOSIS — K219 Gastro-esophageal reflux disease without esophagitis: Secondary | ICD-10-CM | POA: Diagnosis not present

## 2023-12-27 DIAGNOSIS — Z0000 Encounter for general adult medical examination without abnormal findings: Secondary | ICD-10-CM | POA: Diagnosis not present

## 2023-12-27 MED ORDER — ROSUVASTATIN CALCIUM 20 MG PO TABS
20.0000 mg | ORAL_TABLET | Freq: Every day | ORAL | 3 refills | Status: AC
  Filled 2023-12-27: qty 90, 90d supply, fill #0
  Filled 2024-03-26: qty 90, 90d supply, fill #1
  Filled 2024-06-29: qty 90, 90d supply, fill #2
  Filled 2024-09-23: qty 90, 90d supply, fill #3

## 2023-12-27 MED ORDER — LOSARTAN POTASSIUM-HCTZ 100-25 MG PO TABS
1.0000 | ORAL_TABLET | Freq: Every day | ORAL | 3 refills | Status: AC
  Filled 2023-12-27: qty 90, 90d supply, fill #0
  Filled 2024-03-26: qty 90, 90d supply, fill #1
  Filled 2024-06-29: qty 90, 90d supply, fill #2
  Filled 2024-09-23: qty 90, 90d supply, fill #3

## 2024-01-01 DIAGNOSIS — F4324 Adjustment disorder with disturbance of conduct: Secondary | ICD-10-CM | POA: Diagnosis not present

## 2024-01-07 ENCOUNTER — Other Ambulatory Visit (HOSPITAL_COMMUNITY): Payer: Self-pay

## 2024-01-07 DIAGNOSIS — M25562 Pain in left knee: Secondary | ICD-10-CM | POA: Diagnosis not present

## 2024-01-07 MED ORDER — PREDNISONE 10 MG PO TABS
ORAL_TABLET | ORAL | 0 refills | Status: AC
  Filled 2024-01-07: qty 21, 12d supply, fill #0

## 2024-01-20 ENCOUNTER — Other Ambulatory Visit (HOSPITAL_COMMUNITY): Payer: Self-pay

## 2024-01-21 ENCOUNTER — Other Ambulatory Visit (HOSPITAL_COMMUNITY): Payer: Self-pay

## 2024-01-21 ENCOUNTER — Other Ambulatory Visit: Payer: Self-pay

## 2024-01-21 MED ORDER — PREMARIN 0.625 MG PO TABS
0.6250 mg | ORAL_TABLET | Freq: Every day | ORAL | 1 refills | Status: DC
  Filled 2024-01-21: qty 90, 90d supply, fill #0
  Filled 2024-04-19: qty 90, 90d supply, fill #1

## 2024-02-06 ENCOUNTER — Other Ambulatory Visit (HOSPITAL_COMMUNITY): Payer: Self-pay

## 2024-02-18 ENCOUNTER — Other Ambulatory Visit (HOSPITAL_COMMUNITY): Payer: Self-pay

## 2024-02-18 MED ORDER — PREDNISONE 10 MG PO TABS
10.0000 mg | ORAL_TABLET | Freq: Three times a day (TID) | ORAL | 0 refills | Status: AC
  Filled 2024-02-18: qty 42, 14d supply, fill #0

## 2024-02-21 DIAGNOSIS — I10 Essential (primary) hypertension: Secondary | ICD-10-CM | POA: Diagnosis not present

## 2024-02-21 DIAGNOSIS — E7800 Pure hypercholesterolemia, unspecified: Secondary | ICD-10-CM | POA: Diagnosis not present

## 2024-03-09 ENCOUNTER — Other Ambulatory Visit (HOSPITAL_COMMUNITY): Payer: Self-pay

## 2024-03-09 MED ORDER — ESOMEPRAZOLE MAGNESIUM 40 MG PO CPDR
40.0000 mg | DELAYED_RELEASE_CAPSULE | Freq: Every day | ORAL | 1 refills | Status: DC
  Filled 2024-03-09: qty 90, 90d supply, fill #0
  Filled 2024-06-09: qty 90, 90d supply, fill #1
  Filled 2024-09-09: qty 90, 90d supply, fill #2

## 2024-04-07 DIAGNOSIS — M1712 Unilateral primary osteoarthritis, left knee: Secondary | ICD-10-CM | POA: Diagnosis not present

## 2024-04-07 DIAGNOSIS — M25562 Pain in left knee: Secondary | ICD-10-CM | POA: Diagnosis not present

## 2024-04-20 ENCOUNTER — Other Ambulatory Visit (HOSPITAL_COMMUNITY): Payer: Self-pay

## 2024-04-20 DIAGNOSIS — M1712 Unilateral primary osteoarthritis, left knee: Secondary | ICD-10-CM | POA: Diagnosis not present

## 2024-06-05 ENCOUNTER — Other Ambulatory Visit: Payer: Self-pay | Admitting: Family Medicine

## 2024-06-05 DIAGNOSIS — Z1231 Encounter for screening mammogram for malignant neoplasm of breast: Secondary | ICD-10-CM

## 2024-06-08 DIAGNOSIS — M25562 Pain in left knee: Secondary | ICD-10-CM | POA: Diagnosis not present

## 2024-06-09 ENCOUNTER — Other Ambulatory Visit: Payer: Self-pay

## 2024-06-09 ENCOUNTER — Other Ambulatory Visit (HOSPITAL_COMMUNITY): Payer: Self-pay

## 2024-06-18 ENCOUNTER — Other Ambulatory Visit (HOSPITAL_COMMUNITY): Payer: Self-pay

## 2024-07-01 ENCOUNTER — Ambulatory Visit
Admission: RE | Admit: 2024-07-01 | Discharge: 2024-07-01 | Disposition: A | Discharge status: Home / Self Care | Source: Ambulatory Visit | Attending: Family Medicine | Admitting: Family Medicine

## 2024-07-01 DIAGNOSIS — Z1231 Encounter for screening mammogram for malignant neoplasm of breast: Secondary | ICD-10-CM | POA: Diagnosis not present

## 2024-07-06 DIAGNOSIS — E7800 Pure hypercholesterolemia, unspecified: Secondary | ICD-10-CM | POA: Diagnosis not present

## 2024-07-06 DIAGNOSIS — E669 Obesity, unspecified: Secondary | ICD-10-CM | POA: Diagnosis not present

## 2024-07-06 DIAGNOSIS — I10 Essential (primary) hypertension: Secondary | ICD-10-CM | POA: Diagnosis not present

## 2024-07-07 DIAGNOSIS — E876 Hypokalemia: Secondary | ICD-10-CM | POA: Diagnosis not present

## 2024-07-07 DIAGNOSIS — Z780 Asymptomatic menopausal state: Secondary | ICD-10-CM | POA: Diagnosis not present

## 2024-07-07 DIAGNOSIS — N3281 Overactive bladder: Secondary | ICD-10-CM | POA: Diagnosis not present

## 2024-07-07 DIAGNOSIS — J309 Allergic rhinitis, unspecified: Secondary | ICD-10-CM | POA: Diagnosis not present

## 2024-07-07 DIAGNOSIS — K219 Gastro-esophageal reflux disease without esophagitis: Secondary | ICD-10-CM | POA: Diagnosis not present

## 2024-07-07 DIAGNOSIS — F320 Major depressive disorder, single episode, mild: Secondary | ICD-10-CM | POA: Diagnosis not present

## 2024-07-07 DIAGNOSIS — E7800 Pure hypercholesterolemia, unspecified: Secondary | ICD-10-CM | POA: Diagnosis not present

## 2024-07-07 DIAGNOSIS — E669 Obesity, unspecified: Secondary | ICD-10-CM | POA: Diagnosis not present

## 2024-07-07 DIAGNOSIS — I10 Essential (primary) hypertension: Secondary | ICD-10-CM | POA: Diagnosis not present

## 2024-07-11 ENCOUNTER — Other Ambulatory Visit: Payer: Self-pay

## 2024-07-11 ENCOUNTER — Other Ambulatory Visit (HOSPITAL_BASED_OUTPATIENT_CLINIC_OR_DEPARTMENT_OTHER): Payer: Self-pay

## 2024-07-11 ENCOUNTER — Ambulatory Visit
Admission: RE | Admit: 2024-07-11 | Discharge: 2024-07-11 | Disposition: A | Discharge status: Home / Self Care | Attending: Physician Assistant | Admitting: Physician Assistant

## 2024-07-11 VITALS — BP 130/81 | HR 85 | Temp 98.1°F | Resp 19 | Ht 67.0 in | Wt 249.0 lb

## 2024-07-11 DIAGNOSIS — R09A2 Foreign body sensation, throat: Secondary | ICD-10-CM

## 2024-07-11 DIAGNOSIS — J029 Acute pharyngitis, unspecified: Secondary | ICD-10-CM | POA: Diagnosis not present

## 2024-07-11 LAB — POCT RAPID STREP A (OFFICE): Rapid Strep A Screen: NEGATIVE

## 2024-07-11 MED ORDER — LIDOCAINE VISCOUS HCL 2 % MT SOLN
15.0000 mL | OROMUCOSAL | 0 refills | Status: AC | PRN
  Filled 2024-07-11: qty 100, 7d supply, fill #0

## 2024-07-11 NOTE — ED Provider Notes (Signed)
 [facility]    CSN: [id number] Arrival date & time: 07/11/24  9146      History   Chief Complaint Chief Complaint  Patient presents with   Sore Throat    I have a dry cough, feels like a have a lump in my throat. Hurts when I swallow. No fever - Entered by patient    HPI [name] is a 53 y.o. female.  has a past medical history of Cervical cancer (HCC) (1995), Depression, GERD (gastroesophageal reflux disease), Hyperlipidemia, Hypertension, Peptic ulcer disease, Seasonal allergies, and Urinary incontinence.   HPI  Discussed the use of AI scribe software for clinical note transcription with the patient, who gave verbal consent to proceed. The patient, with gastroesophageal reflux disease, presents with painful swallowing and throat irritation.  They have been experiencing a 'hacky' cough and sinus drainage, which they associate with allergies. They have been taking Claritin daily for allergies and have also used Mucinex and Sudafed for the cough. Despite these medications, they have developed a sensation of fullness in the throat, which they initially attributed to allergies.  Yesterday afternoon, they began experiencing a painful sensation when swallowing, described as being further down the throat rather than at the back. This pain intensified last night, making it difficult to eat or drink. They attempted to alleviate the discomfort with warm salt water gargles and popsicles, but these measures were ineffective. The pain occurs during the second phase of swallowing and is described as worse than a typical sore throat.  No fever, body aches, chest pain, bad taste in the mouth, nausea, and chills. They continue to have a cough but do not find it bothersome. They have a history of reflux for which they take daily medication, but they have not experienced heartburn recently.  They have been on Claritin for a couple of years and have previously tried Zyrtec without  success.    Past Medical History:  Diagnosis Date   Cervical cancer (HCC) 1995   Depression    on meds   GERD (gastroesophageal reflux disease)    on meds   Hyperlipidemia    on meds   Hypertension    on meds   Peptic ulcer disease    Seasonal allergies    Urinary incontinence     Patient Active Problem List   Diagnosis Date Noted   Suicide attempt (HCC) 04/07/2013   Benzodiazepine overdose 04/07/2013   Hypertension 04/07/2013   Hyperlipidemia 04/07/2013   Hypokalemia 04/07/2013   Depression 04/07/2013   Pulmonary nodule, right 12/27/2011    Past Surgical History:  Procedure Laterality Date   BREAST BIOPSY Left    CARDIAC CATHETERIZATION     CESAREAN SECTION     COMBINED HYSTERECTOMY ABDOMINAL W/ MMK / BURCH PROCEDURE     ENDOMETRIAL ABLATION  12/01/2004   thelbert 03/07/2011 (04/07/2013)   KNEE ARTHROSCOPY W/ MENISCAL REPAIR Left 2020   LAPAROSCOPIC CHOLECYSTECTOMY  01/26/2004   thelbert 03/07/2011 (04/07/2013)   TUBAL LIGATION  08/21/2001   thelbert 03/07/2011 (04/07/2013)   UPPER GASTROINTESTINAL ENDOSCOPY     gastric ulcer and refulx found    OB History   No obstetric history on file.      Home Medications    Prior to Admission medications   Medication Sig Start Date End Date Taking? Authorizing Provider  lidocaine  (XYLOCAINE ) 2 % solution Use as directed 15 mLs in the mouth or throat as needed. 07/11/24  Yes Miela Desjardin E, PA-C  albuterol  (VENTOLIN  HFA)  108 (90 Base) MCG/ACT inhaler Inhale 2 puffs into the lungs every 6 (six) hours as needed for wheezing or shortness of breath. 10/09/22   Nahser, Aleene PARAS, MD  benzonatate  (TESSALON ) 100 MG capsule Take 1 capsule (100 mg total) by mouth 3 (three) times daily as needed. 10/11/22   Wonda Sharper, MD  esomeprazole  (NEXIUM ) 40 MG capsule Take 1 capsule (40 mg total) by mouth daily. 03/09/24     estrogens , conjugated, (PREMARIN ) 0.625 MG tablet Take 1 tablet (0.625 mg total) by mouth daily. 01/21/24     FLUoxetine   (PROZAC ) 40 MG capsule Take 1 capsule (40 mg total) by mouth every morning. 04/26/23     FLUoxetine  (PROZAC ) 40 MG capsule Take 1 capsule (40 mg total) by mouth 2 (two) times daily. 10/09/23     hydrochlorothiazide  (HYDRODIURIL ) 12.5 MG tablet Take 1 tablet (12.5 mg total) by mouth daily. 07/04/23     HYDROcodone  bit-homatropine (HYDROMET) 5-1.5 MG/5ML syrup Take 5 mLs by mouth every 6 hours as needed. 10/30/22     loratadine (CLARITIN) 10 MG tablet Take 1 tablet by mouth daily at 6 (six) AM.    [provider]  losartan -hydrochlorothiazide  (HYZAAR ) 100-12.5 MG tablet Take 1 tablet by mouth daily. 06/27/23     losartan -hydrochlorothiazide  (HYZAAR ) 100-12.5 MG tablet Take 1 tablet by mouth daily for 90 days. 08/23/23     losartan -hydrochlorothiazide  (HYZAAR ) 100-25 MG tablet Take 1 tablet by mouth daily. 12/27/23     meloxicam  (MOBIC ) 15 MG tablet Take 1 tablet (15 mg total) by mouth daily. 08/09/23     mirabegron  ER (MYRBETRIQ ) 25 MG TB24 tablet Take 1 tablet (25 mg total) by mouth daily. 08/17/21     mirabegron  ER (MYRBETRIQ ) 50 MG TB24 tablet Take 1 tablet (50 mg total) by mouth daily. 08/28/22     mirabegron  ER (MYRBETRIQ ) 50 MG TB24 tablet Take 1 tablet (50 mg total) by mouth daily. 11/27/23     potassium chloride  (KLOR-CON  M) 10 MEQ tablet Take 1 tablet (10 mEq total) by mouth daily with food 07/04/23     predniSONE  (DELTASONE ) 10 MG tablet Take 1 tablet (10 mg total) by mouth 3 (three) times daily. 02/18/24     predniSONE  (STERAPRED UNI-PAK 21 TAB) 10 MG (21) TBPK tablet 6 day taper; take as directed on package instructions 10/13/22   Vivienne Delon HERO, PA-C  promethazine -dextromethorphan (PROMETHAZINE -DM) 6.25-15 MG/5ML syrup Take 5 mLs by mouth 4 (four) times daily as needed. 10/13/22   Vivienne Delon HERO, PA-C  rosuvastatin  (CRESTOR ) 20 MG tablet Take 1 tablet (20 mg total) by mouth daily. 12/27/23     simvastatin  (ZOCOR ) 20 MG tablet Take 1 tablet (20 mg total) by mouth every evening. 12/16/23      traZODone  (DESYREL ) 50 MG tablet Take 1-2 tablets (50-100 mg total) by mouth at bedtime. 10/09/23     ranitidine (ZANTAC) 150 MG tablet Take 150 mg by mouth as needed.  12/27/11  [provider]  sucralfate  (CARAFATE ) 1 GM/10ML suspension Take 10 mLs (1 g total) by mouth 4 (four) times daily. 12/07/11 12/27/11  Obie Princella HERO, MD    Family History Family History  Problem Relation Age of Onset   Heart attack Mother    Colon polyps Father 17   Heart disease Father    Uterine cancer Maternal Grandmother    Lung cancer Maternal Grandfather    Colon polyps Paternal Grandmother 67   Colon cancer Paternal Grandmother 60   Breast cancer Maternal Aunt  x 3   Breast cancer Paternal Aunt        x 3   Esophageal cancer Neg Hx    Stomach cancer Neg Hx    Rectal cancer Neg Hx     Social History Social History   Tobacco Use   Smoking status: Never   Smokeless tobacco: Never  Vaping Use   Vaping status: Never Used  Substance Use Topics   Alcohol use: No   Drug use: No     Allergies   Aspirin, Gabapentin , and Ace inhibitors   Review of Systems Review of Systems  Constitutional:  Negative for chills and fever.  HENT:  Positive for postnasal drip and sore throat.   Respiratory:  Positive for cough. Negative for shortness of breath and wheezing.   Musculoskeletal:  Negative for myalgias.     Physical Exam Triage Vital Signs ED Triage Vitals  Encounter Vitals Group     BP 07/11/24 0901 130/81     Girls Systolic BP Percentile --      Girls Diastolic BP Percentile --      Boys Systolic BP Percentile --      Boys Diastolic BP Percentile --      Pulse Rate 07/11/24 0901 85     Resp 07/11/24 0901 19     Temp 07/11/24 0901 98.1 F (36.7 C)     Temp Source 07/11/24 0901 Oral     SpO2 07/11/24 0901 97 %     Weight 07/11/24 0901 249 lb (112.9 kg)     Height 07/11/24 0901 5' 7 (1.702 m)     Head Circumference --      Peak Flow --      Pain Score 07/11/24 0911 3      Pain Loc --      Pain Education --      Exclude from Growth Chart --    No data found.  Updated Vital Signs BP 130/81 (BP Location: Right Arm)   Pulse 85   Temp 98.1 F (36.7 C) (Oral)   Resp 19   Ht 5' 7 (1.702 m)   Wt 249 lb (112.9 kg)   LMP 10/16/2006   SpO2 97%   BMI 39.00 kg/m   Visual Acuity Right Eye Distance:   Left Eye Distance:   Bilateral Distance:    Right Eye Near:   Left Eye Near:    Bilateral Near:     Physical Exam Vitals reviewed.  Constitutional:      General: She is awake.     Appearance: Normal appearance. She is well-developed and well-groomed.  HENT:     Head: Normocephalic and atraumatic.     Right Ear: Hearing, tympanic membrane and ear canal normal.     Left Ear: Hearing, tympanic membrane and ear canal normal.     Mouth/Throat:     Lips: Pink.     Mouth: Mucous membranes are moist.     Pharynx: Oropharynx is clear. Uvula midline. No pharyngeal swelling, oropharyngeal exudate, posterior oropharyngeal erythema, uvula swelling or postnasal drip.  Cardiovascular:     Rate and Rhythm: Normal rate and regular rhythm.     Pulses: Normal pulses.          Radial pulses are 2+ on the right side and 2+ on the left side.     Heart sounds: Normal heart sounds. No murmur heard.    No friction rub. No gallop.  Pulmonary:     Effort: Pulmonary effort is normal.  Breath sounds: Normal breath sounds. No decreased air movement. No decreased breath sounds, wheezing, rhonchi or rales.  Musculoskeletal:     Cervical back: Normal range of motion and neck supple.  Lymphadenopathy:     Head:     Right side of head: No submental, submandibular or preauricular adenopathy.     Left side of head: No submental, submandibular or preauricular adenopathy.     Cervical:     Right cervical: No superficial cervical adenopathy.    Left cervical: No superficial cervical adenopathy.     Upper Body:     Right upper body: No supraclavicular adenopathy.     Left  upper body: No supraclavicular adenopathy.  Neurological:     Mental Status: She is alert.  Psychiatric:        Mood and Affect: Mood normal.        Behavior: Behavior normal. Behavior is cooperative.      UC Treatments / Results  Labs (all labs ordered are listed, but only abnormal results are displayed) Labs Reviewed  POCT RAPID STREP A (OFFICE)    EKG   Radiology No results found.  Procedures Procedures (including critical care time)  Medications Ordered in UC Medications - No data to display  Initial Impression / Assessment and Plan / UC Course  I have reviewed the triage vital signs and the nursing notes.  Pertinent labs & imaging results that were available during my care of the patient were reviewed by me and considered in my medical decision making (see chart for details).      Final Clinical Impressions(s) / UC Diagnoses   Final diagnoses:  Sore throat  Globus sensation   Acute pharyngitis with odynophagia Acute onset of severe odynophagia, localized further down the throat. No fever, body aches, or chills. Negative strep test. Likely irritation due to sinus drainage or possible reflux-related globus sensation. - Prescribe viscous lidocaine  for discomfort.  Acute cough Persistent cough associated with sinus drainage. Managed with over-the-counter Mucinex and Sudafed. - Continue current over-the-counter medications.  Allergic rhinitis Chronic allergic rhinitis managed with daily Claritin. Symptoms include sinus drainage and cough. Previous trial of Zyrtec was ineffective. - Consider switching antihistamine to Allegra for potential increased efficacy.  Gastroesophageal reflux disease (GERD) GERD managed with daily medication. No recent heartburn, but possible irritation from mucus drainage exacerbating symptoms. - Continue current reflux medication. - Add Pepto or Tums to regimen to see if it helps with throat irritation.    Discharge Instructions       VISIT SUMMARY:  You came in today because you have been experiencing painful swallowing and throat irritation. You also mentioned having a persistent cough and sinus drainage, which you believe are due to allergies. Despite taking Claritin, Mucinex, and Sudafed, you developed a sensation of fullness in your throat and recently started experiencing severe pain when swallowing. You have a history of gastroesophageal reflux disease (GERD) but have not had heartburn recently.  YOUR PLAN:  -ACUTE PHARYNGITIS WITH ODYNOPHAGIA: Acute pharyngitis with odynophagia means you have a sudden onset of severe throat pain when swallowing. This is likely due to irritation from sinus drainage or possibly related to your reflux. We have prescribed viscous lidocaine  to help alleviate the discomfort.  -ACUTE COUGH: An acute cough is a sudden cough that has been persistent. It is associated with your sinus drainage. You should continue taking your over-the-counter medications, Mucinex and Sudafed, to manage this symptom.  -ALLERGIC RHINITIS: Allergic rhinitis is inflammation of the inside of  your nose caused by allergies. It leads to symptoms like sinus drainage and cough. Since Claritin has not been fully effective, we suggest trying Allegra to see if it works better for you.  -GASTROESOPHAGEAL REFLUX DISEASE (GERD): GERD is a condition where stomach acid frequently flows back into the tube connecting your mouth and stomach. You should continue your current medication for reflux and add Pepto or Tums to help with throat irritation.  INSTRUCTIONS:  Please follow up if your symptoms do not improve or if they worsen. Continue taking your current medications as directed and try the new recommendations. If you have any questions or concerns, do not hesitate to contact our office.     ED Prescriptions     Medication Sig Dispense Auth. Provider   lidocaine  (XYLOCAINE ) 2 % solution Use as directed 15 mLs in the  mouth or throat as needed. 100 mL Kazimir Hartnett E, PA-C      PDMP not reviewed this encounter.   Marylene Rocky BRAVO, PA-C 07/11/24 1432

## 2024-07-11 NOTE — Discharge Instructions (Addendum)
 VISIT SUMMARY:  You came in today because you have been experiencing painful swallowing and throat irritation. You also mentioned having a persistent cough and sinus drainage, which you believe are due to allergies. Despite taking Claritin, Mucinex, and Sudafed, you developed a sensation of fullness in your throat and recently started experiencing severe pain when swallowing. You have a history of gastroesophageal reflux disease (GERD) but have not had heartburn recently.  YOUR PLAN:  -ACUTE PHARYNGITIS WITH ODYNOPHAGIA: Acute pharyngitis with odynophagia means you have a sudden onset of severe throat pain when swallowing. This is likely due to irritation from sinus drainage or possibly related to your reflux. We have prescribed viscous lidocaine  to help alleviate the discomfort.  -ACUTE COUGH: An acute cough is a sudden cough that has been persistent. It is associated with your sinus drainage. You should continue taking your over-the-counter medications, Mucinex and Sudafed, to manage this symptom.  -ALLERGIC RHINITIS: Allergic rhinitis is inflammation of the inside of your nose caused by allergies. It leads to symptoms like sinus drainage and cough. Since Claritin has not been fully effective, we suggest trying Allegra to see if it works better for you.  -GASTROESOPHAGEAL REFLUX DISEASE (GERD): GERD is a condition where stomach acid frequently flows back into the tube connecting your mouth and stomach. You should continue your current medication for reflux and add Pepto or Tums to help with throat irritation.  INSTRUCTIONS:  Please follow up if your symptoms do not improve or if they worsen. Continue taking your current medications as directed and try the new recommendations. If you have any questions or concerns, do not hesitate to contact our office.

## 2024-07-11 NOTE — ED Triage Notes (Addendum)
 Pt presents with a chief complaint of sore throat. Became concerned last night when she had pain with swallowing. Currently rates overall throat pain a 3/10. Feels like she cannot eat or drink due to the pain. Takes Claritin daily. Added Mucinex + Sudafed with no improvement in throat pain. No fevers.

## 2024-07-12 ENCOUNTER — Other Ambulatory Visit (HOSPITAL_COMMUNITY): Payer: Self-pay

## 2024-07-13 ENCOUNTER — Other Ambulatory Visit (HOSPITAL_COMMUNITY): Payer: Self-pay

## 2024-07-13 DIAGNOSIS — J989 Respiratory disorder, unspecified: Secondary | ICD-10-CM | POA: Diagnosis not present

## 2024-07-13 DIAGNOSIS — R059 Cough, unspecified: Secondary | ICD-10-CM | POA: Diagnosis not present

## 2024-07-13 DIAGNOSIS — I10 Essential (primary) hypertension: Secondary | ICD-10-CM | POA: Diagnosis not present

## 2024-07-13 DIAGNOSIS — K219 Gastro-esophageal reflux disease without esophagitis: Secondary | ICD-10-CM | POA: Diagnosis not present

## 2024-07-13 DIAGNOSIS — J45909 Unspecified asthma, uncomplicated: Secondary | ICD-10-CM | POA: Diagnosis not present

## 2024-07-13 MED ORDER — PREMARIN 0.625 MG PO TABS
0.6250 mg | ORAL_TABLET | Freq: Every day | ORAL | 1 refills | Status: AC
  Filled 2024-07-13: qty 90, 90d supply, fill #0
  Filled 2024-10-12: qty 90, 90d supply, fill #1

## 2024-07-14 ENCOUNTER — Other Ambulatory Visit (HOSPITAL_COMMUNITY): Payer: Self-pay

## 2024-07-14 DIAGNOSIS — J069 Acute upper respiratory infection, unspecified: Secondary | ICD-10-CM | POA: Diagnosis not present

## 2024-07-14 DIAGNOSIS — H109 Unspecified conjunctivitis: Secondary | ICD-10-CM | POA: Diagnosis not present

## 2024-07-14 MED ORDER — OFLOXACIN 0.3 % OP SOLN
1.0000 [drp] | Freq: Four times a day (QID) | OPHTHALMIC | 1 refills | Status: AC
  Filled 2024-07-14: qty 5, 13d supply, fill #0

## 2024-07-15 ENCOUNTER — Other Ambulatory Visit (HOSPITAL_COMMUNITY): Payer: Self-pay

## 2024-07-15 MED ORDER — HYDROCODONE BIT-HOMATROP MBR 5-1.5 MG/5ML PO SOLN
5.0000 mL | Freq: Four times a day (QID) | ORAL | 0 refills | Status: AC | PRN
  Filled 2024-07-15: qty 120, 6d supply, fill #0

## 2024-07-15 MED ORDER — BENZONATATE 100 MG PO CAPS
100.0000 mg | ORAL_CAPSULE | Freq: Three times a day (TID) | ORAL | 0 refills | Status: AC | PRN
  Filled 2024-07-15 (×2): qty 30, 5d supply, fill #0

## 2024-07-26 ENCOUNTER — Other Ambulatory Visit: Payer: Self-pay

## 2024-07-26 ENCOUNTER — Encounter: Payer: Self-pay | Admitting: Emergency Medicine

## 2024-07-26 ENCOUNTER — Ambulatory Visit
Admission: EM | Admit: 2024-07-26 | Discharge: 2024-07-26 | Disposition: A | Discharge status: Home / Self Care | Attending: Family Medicine | Admitting: Family Medicine

## 2024-07-26 DIAGNOSIS — J208 Acute bronchitis due to other specified organisms: Secondary | ICD-10-CM

## 2024-07-26 MED ORDER — ALBUTEROL SULFATE HFA 108 (90 BASE) MCG/ACT IN AERS
2.0000 | INHALATION_SPRAY | Freq: Once | RESPIRATORY_TRACT | Status: AC
  Administered 2024-07-26: 2 via RESPIRATORY_TRACT

## 2024-07-26 MED ORDER — DEXAMETHASONE SODIUM PHOSPHATE 10 MG/ML IJ SOLN
10.0000 mg | Freq: Once | INTRAMUSCULAR | Status: AC
  Administered 2024-07-26: 10 mg via INTRAMUSCULAR

## 2024-07-26 NOTE — ED Notes (Addendum)
 Pt noted to have several coughing spells and dry throat. Pt provided gatorade and emesis bag. NAD noted. Provider aware.

## 2024-07-26 NOTE — Discharge Instructions (Signed)
 We have given you a steroid shot today and an albuterol  inhaler.  You may use the albuterol  inhaler 2 puffs every 4 hours as needed for wheezing, chest tightness and/or coughing fits.  Continue taking Mucinex, Delsym, nasal sprays and allergy medications and stay well-hydrated.  Follow-up for worsening or unresolving symptoms

## 2024-07-26 NOTE — ED Triage Notes (Signed)
 Pt reports cough for last several weeks and has been deen at PCP and UC. Has been taking hydrocodone  cough syrup with minimal improvement of symptoms. States thought cough was improving but reports coughing spells lately that cause me to vomit. Denies any known fevers.

## 2024-07-29 NOTE — ED Provider Notes (Signed)
 [facility]    CSN: [id number] Arrival date & time: 07/26/24  1447      History   Chief Complaint Chief Complaint  Patient presents with   Cough    HPI [name] is a 53 y.o. female.   Presenting today with several week history of progressively worsening cough, wheezing, and now coughing so hard that she vomits. Denies fever, chills, CP, abdominal pain, diarrhea. So far trying OTC cold and congestion medications with minimal relief. No known diagnosed chronic pulmonary dz but has required inhalers when sick previously.    Past Medical History:  Diagnosis Date   Cervical cancer (HCC) 1995   Depression    on meds   GERD (gastroesophageal reflux disease)    on meds   Hyperlipidemia    on meds   Hypertension    on meds   Peptic ulcer disease    Seasonal allergies    Urinary incontinence     Patient Active Problem List   Diagnosis Date Noted   Suicide attempt (HCC) 04/07/2013   Benzodiazepine overdose 04/07/2013   Hypertension 04/07/2013   Hyperlipidemia 04/07/2013   Hypokalemia 04/07/2013   Depression 04/07/2013   Pulmonary nodule, right 12/27/2011    Past Surgical History:  Procedure Laterality Date   BREAST BIOPSY Left    CARDIAC CATHETERIZATION     CESAREAN SECTION     COMBINED HYSTERECTOMY ABDOMINAL W/ MMK / BURCH PROCEDURE     ENDOMETRIAL ABLATION  12/01/2004   thelbert 03/07/2011 (04/07/2013)   KNEE ARTHROSCOPY W/ MENISCAL REPAIR Left 2020   LAPAROSCOPIC CHOLECYSTECTOMY  01/26/2004   thelbert 03/07/2011 (04/07/2013)   TUBAL LIGATION  08/21/2001   thelbert 03/07/2011 (04/07/2013)   UPPER GASTROINTESTINAL ENDOSCOPY     gastric ulcer and refulx found    OB History   No obstetric history on file.      Home Medications    Prior to Admission medications   Medication Sig Start Date End Date Taking? Authorizing Provider  albuterol  (VENTOLIN  HFA) 108 (90 Base) MCG/ACT inhaler Inhale 2 puffs into the lungs every 6 (six) hours as needed for  wheezing or shortness of breath. 10/09/22   Nahser, Aleene PARAS, MD  benzonatate  (TESSALON ) 100 MG capsule Take 1 capsule (100 mg total) by mouth 3 (three) times daily as needed. 10/11/22   Wonda Sharper, MD  benzonatate  (TESSALON ) 100 MG capsule Take 1-2 capsules (100-200 mg total) by mouth 3 (three) times daily as needed. 07/15/24     esomeprazole  (NEXIUM ) 40 MG capsule Take 1 capsule (40 mg total) by mouth daily. 03/09/24     estrogens , conjugated, (PREMARIN ) 0.625 MG tablet Take 1 tablet (0.625 mg total) by mouth daily. 07/13/24     FLUoxetine  (PROZAC ) 40 MG capsule Take 1 capsule (40 mg total) by mouth every morning. 04/26/23     FLUoxetine  (PROZAC ) 40 MG capsule Take 1 capsule (40 mg total) by mouth 2 (two) times daily. 10/09/23     hydrochlorothiazide  (HYDRODIURIL ) 12.5 MG tablet Take 1 tablet (12.5 mg total) by mouth daily. 07/04/23     HYDROcodone  bit-homatropine (HYCODAN) 5-1.5 MG/5ML syrup Take 5 mLs by mouth every 6 (six) hours as needed. 07/15/24     HYDROcodone  bit-homatropine (HYDROMET) 5-1.5 MG/5ML syrup Take 5 mLs by mouth every 6 hours as needed. 10/30/22     lidocaine  (XYLOCAINE ) 2 % solution Use as directed 15 mLs in the mouth or throat as needed. 07/11/24   Mecum, Erin E, PA-C  loratadine (CLARITIN) 10 MG  tablet Take 1 tablet by mouth daily at 6 (six) AM.    [provider]  losartan -hydrochlorothiazide  (HYZAAR ) 100-12.5 MG tablet Take 1 tablet by mouth daily. 06/27/23     losartan -hydrochlorothiazide  (HYZAAR ) 100-12.5 MG tablet Take 1 tablet by mouth daily for 90 days. 08/23/23     losartan -hydrochlorothiazide  (HYZAAR ) 100-25 MG tablet Take 1 tablet by mouth daily. 12/27/23     meloxicam  (MOBIC ) 15 MG tablet Take 1 tablet (15 mg total) by mouth daily. 08/09/23     mirabegron  ER (MYRBETRIQ ) 25 MG TB24 tablet Take 1 tablet (25 mg total) by mouth daily. 08/17/21     mirabegron  ER (MYRBETRIQ ) 50 MG TB24 tablet Take 1 tablet (50 mg total) by mouth daily. 08/28/22     mirabegron  ER  (MYRBETRIQ ) 50 MG TB24 tablet Take 1 tablet (50 mg total) by mouth daily. 11/27/23     ofloxacin  (OCUFLOX ) 0.3 % ophthalmic solution Place 1 drop into affected eye(s) every 6 (six) hours. 07/14/24     potassium chloride  (KLOR-CON  M) 10 MEQ tablet Take 1 tablet (10 mEq total) by mouth daily with food 07/04/23     predniSONE  (DELTASONE ) 10 MG tablet Take 1 tablet (10 mg total) by mouth 3 (three) times daily. 02/18/24     predniSONE  (STERAPRED UNI-PAK 21 TAB) 10 MG (21) TBPK tablet 6 day taper; take as directed on package instructions 10/13/22   Burnette, Jennifer M, PA-C  promethazine -dextromethorphan (PROMETHAZINE -DM) 6.25-15 MG/5ML syrup Take 5 mLs by mouth 4 (four) times daily as needed. 10/13/22   Vivienne Delon HERO, PA-C  rosuvastatin  (CRESTOR ) 20 MG tablet Take 1 tablet (20 mg total) by mouth daily. 12/27/23     simvastatin  (ZOCOR ) 20 MG tablet Take 1 tablet (20 mg total) by mouth every evening. 12/16/23     traZODone  (DESYREL ) 50 MG tablet Take 1-2 tablets (50-100 mg total) by mouth at bedtime. 10/09/23     ranitidine (ZANTAC) 150 MG tablet Take 150 mg by mouth as needed.  12/27/11  [provider]  sucralfate  (CARAFATE ) 1 GM/10ML suspension Take 10 mLs (1 g total) by mouth 4 (four) times daily. 12/07/11 12/27/11  Obie Princella HERO, MD    Family History Family History  Problem Relation Age of Onset   Heart attack Mother    Colon polyps Father 19   Heart disease Father    Uterine cancer Maternal Grandmother    Lung cancer Maternal Grandfather    Colon polyps Paternal Grandmother 49   Colon cancer Paternal Grandmother 26   Breast cancer Maternal Aunt        x 3   Breast cancer Paternal Aunt        x 3   Esophageal cancer Neg Hx    Stomach cancer Neg Hx    Rectal cancer Neg Hx     Social History Social History   Tobacco Use   Smoking status: Never   Smokeless tobacco: Never  Vaping Use   Vaping status: Never Used  Substance Use Topics   Alcohol use: No   Drug use: No      Allergies   Aspirin, Gabapentin , and Ace inhibitors   Review of Systems Review of Systems PER HPI  Physical Exam Triage Vital Signs ED Triage Vitals  Encounter Vitals Group     BP 07/26/24 1513 119/88     Girls Systolic BP Percentile --      Girls Diastolic BP Percentile --      Boys Systolic BP Percentile --  Boys Diastolic BP Percentile --      Pulse Rate 07/26/24 1513 61     Resp 07/26/24 1513 20     Temp 07/26/24 1513 98 F (36.7 C)     Temp Source 07/26/24 1513 Oral     SpO2 07/26/24 1513 96 %     Weight --      Height --      Head Circumference --      Peak Flow --      Pain Score 07/26/24 1511 0     Pain Loc --      Pain Education --      Exclude from Growth Chart --    No data found.  Updated Vital Signs BP 119/88 (BP Location: Right Arm)   Pulse 61   Temp 98 F (36.7 C) (Oral)   Resp 20   LMP 10/16/2006   SpO2 96%   Visual Acuity Right Eye Distance:   Left Eye Distance:   Bilateral Distance:    Right Eye Near:   Left Eye Near:    Bilateral Near:     Physical Exam Vitals and nursing note reviewed.  Constitutional:      Appearance: Normal appearance.  HENT:     Head: Atraumatic.     Right Ear: Tympanic membrane and external ear normal.     Left Ear: Tympanic membrane and external ear normal.     Nose: Rhinorrhea present.     Mouth/Throat:     Mouth: Mucous membranes are moist.     Pharynx: Posterior oropharyngeal erythema present.  Eyes:     Extraocular Movements: Extraocular movements intact.     Conjunctiva/sclera: Conjunctivae normal.  Cardiovascular:     Rate and Rhythm: Normal rate and regular rhythm.     Heart sounds: Normal heart sounds.  Pulmonary:     Effort: Pulmonary effort is normal.     Breath sounds: Wheezing present.  Musculoskeletal:        General: Normal range of motion.     Cervical back: Normal range of motion and neck supple.  Skin:    General: Skin is warm and dry.  Neurological:     Mental Status:  She is alert and oriented to person, place, and time.  Psychiatric:        Mood and Affect: Mood normal.        Thought Content: Thought content normal.      UC Treatments / Results  Labs (all labs ordered are listed, but only abnormal results are displayed) Labs Reviewed - No data to display  EKG   Radiology No results found.  Procedures Procedures (including critical care time)  Medications Ordered in UC Medications  dexamethasone (DECADRON) injection 10 mg (10 mg Intramuscular Given 07/26/24 1557)  albuterol  (VENTOLIN  HFA) 108 (90 Base) MCG/ACT inhaler 2 puff (2 puffs Inhalation Given 07/26/24 1557)    Initial Impression / Assessment and Plan / UC Course  I have reviewed the triage vital signs and the nursing notes.  Pertinent labs & imaging results that were available during my care of the patient were reviewed by me and considered in my medical decision making (see chart for details).     Vitals and exam overall reassuring, suspect viral bronchitis. Treat with IM decadron, albuterol  prn every 4 hours as needed, supportive OTC medications and home care. Return for worsening sxs.  Final Clinical Impressions(s) / UC Diagnoses   Final diagnoses:  Viral bronchitis     Discharge Instructions  We have given you a steroid shot today and an albuterol  inhaler.  You may use the albuterol  inhaler 2 puffs every 4 hours as needed for wheezing, chest tightness and/or coughing fits.  Continue taking Mucinex, Delsym, nasal sprays and allergy medications and stay well-hydrated.  Follow-up for worsening or unresolving symptoms    ED Prescriptions   None    PDMP not reviewed this encounter.   Stuart Millman [place], [place] 07/29/24 [phone]

## 2024-08-05 ENCOUNTER — Other Ambulatory Visit (HOSPITAL_COMMUNITY): Payer: Self-pay

## 2024-08-07 ENCOUNTER — Other Ambulatory Visit (HOSPITAL_COMMUNITY): Payer: Self-pay

## 2024-08-07 MED ORDER — PREVIDENT 5000 ENAMEL PROTECT 1.1-5 % DT GEL
DENTAL | 4 refills | Status: AC
  Filled 2024-08-07: qty 100, 15d supply, fill #0
  Filled 2024-09-03: qty 100, 30d supply, fill #0
  Filled 2024-10-12: qty 100, 30d supply, fill #1

## 2024-08-10 ENCOUNTER — Other Ambulatory Visit (HOSPITAL_COMMUNITY): Payer: Self-pay

## 2024-08-19 ENCOUNTER — Other Ambulatory Visit (HOSPITAL_COMMUNITY): Payer: Self-pay

## 2024-08-20 ENCOUNTER — Other Ambulatory Visit (HOSPITAL_COMMUNITY): Payer: Self-pay

## 2024-08-20 DIAGNOSIS — M25562 Pain in left knee: Secondary | ICD-10-CM | POA: Diagnosis not present

## 2024-08-20 DIAGNOSIS — S81812A Laceration without foreign body, left lower leg, initial encounter: Secondary | ICD-10-CM | POA: Diagnosis not present

## 2024-08-20 MED ORDER — CEPHALEXIN 500 MG PO CAPS
500.0000 mg | ORAL_CAPSULE | Freq: Four times a day (QID) | ORAL | 0 refills | Status: AC
  Filled 2024-08-20: qty 20, 5d supply, fill #0

## 2024-08-31 ENCOUNTER — Other Ambulatory Visit (HOSPITAL_COMMUNITY): Payer: Self-pay

## 2024-08-31 DIAGNOSIS — M25562 Pain in left knee: Secondary | ICD-10-CM | POA: Diagnosis not present

## 2024-08-31 MED ORDER — MELOXICAM 15 MG PO TABS
15.0000 mg | ORAL_TABLET | Freq: Every day | ORAL | 0 refills | Status: AC
  Filled 2024-08-31: qty 30, 30d supply, fill #0

## 2024-09-02 ENCOUNTER — Other Ambulatory Visit (HOSPITAL_COMMUNITY): Payer: Self-pay

## 2024-09-03 ENCOUNTER — Other Ambulatory Visit (HOSPITAL_COMMUNITY): Payer: Self-pay

## 2024-09-03 ENCOUNTER — Other Ambulatory Visit: Payer: Self-pay

## 2024-09-04 ENCOUNTER — Other Ambulatory Visit (HOSPITAL_COMMUNITY): Payer: Self-pay

## 2024-09-04 ENCOUNTER — Other Ambulatory Visit: Payer: Self-pay

## 2024-09-04 MED ORDER — FLUOXETINE HCL 40 MG PO CAPS
40.0000 mg | ORAL_CAPSULE | Freq: Every morning | ORAL | 1 refills | Status: AC
  Filled 2024-09-04: qty 90, 90d supply, fill #0

## 2024-09-09 ENCOUNTER — Other Ambulatory Visit: Payer: Self-pay

## 2024-09-09 ENCOUNTER — Other Ambulatory Visit (HOSPITAL_COMMUNITY): Payer: Self-pay

## 2024-09-09 MED ORDER — ESOMEPRAZOLE MAGNESIUM 40 MG PO CPDR
40.0000 mg | DELAYED_RELEASE_CAPSULE | Freq: Every day | ORAL | 1 refills | Status: AC
  Filled 2024-09-09: qty 90, 90d supply, fill #0
  Filled 2024-10-06: qty 90, 90d supply, fill #1

## 2024-09-23 ENCOUNTER — Other Ambulatory Visit: Payer: Self-pay

## 2024-10-06 ENCOUNTER — Other Ambulatory Visit (HOSPITAL_COMMUNITY): Payer: Self-pay

## 2024-10-06 DIAGNOSIS — J988 Other specified respiratory disorders: Secondary | ICD-10-CM | POA: Diagnosis not present

## 2024-10-06 DIAGNOSIS — J45909 Unspecified asthma, uncomplicated: Secondary | ICD-10-CM | POA: Diagnosis not present

## 2024-10-06 MED ORDER — ALBUTEROL SULFATE HFA 108 (90 BASE) MCG/ACT IN AERS
2.0000 | INHALATION_SPRAY | RESPIRATORY_TRACT | 1 refills | Status: AC
  Filled 2024-10-06: qty 6.7, 25d supply, fill #0

## 2024-10-06 MED ORDER — PROMETHAZINE-DM 6.25-15 MG/5ML PO SYRP
5.0000 mL | ORAL_SOLUTION | Freq: Four times a day (QID) | ORAL | 1 refills | Status: AC | PRN
  Filled 2024-10-06: qty 120, 6d supply, fill #0

## 2024-10-06 MED ORDER — BUDESONIDE-FORMOTEROL FUMARATE 160-4.5 MCG/ACT IN AERO
2.0000 | INHALATION_SPRAY | Freq: Two times a day (BID) | RESPIRATORY_TRACT | 1 refills | Status: AC
  Filled 2024-10-06: qty 10.2, 30d supply, fill #0

## 2024-10-12 ENCOUNTER — Other Ambulatory Visit (HOSPITAL_COMMUNITY): Payer: Self-pay

## 2024-10-12 ENCOUNTER — Other Ambulatory Visit: Payer: Self-pay

## 2024-10-13 ENCOUNTER — Other Ambulatory Visit: Payer: Self-pay

## 2024-10-13 ENCOUNTER — Other Ambulatory Visit (HOSPITAL_COMMUNITY): Payer: Self-pay

## 2024-10-29 ENCOUNTER — Other Ambulatory Visit (HOSPITAL_COMMUNITY): Payer: Self-pay

## 2024-10-29 ENCOUNTER — Encounter: Payer: Self-pay | Admitting: Pharmacist

## 2024-10-29 ENCOUNTER — Other Ambulatory Visit: Payer: Self-pay
